# Patient Record
Sex: Female | Born: 1950 | Race: White | Hispanic: No | Marital: Married | Smoking: Never smoker
Health system: Southern US, Community
[De-identification: ages and names within clinical notes are randomized; demographics above are authoritative.]

## PROBLEM LIST (undated history)

## (undated) DIAGNOSIS — E039 Hypothyroidism, unspecified: Secondary | ICD-10-CM

## (undated) DIAGNOSIS — K5792 Diverticulitis of intestine, part unspecified, without perforation or abscess without bleeding: Secondary | ICD-10-CM

## (undated) DIAGNOSIS — F419 Anxiety disorder, unspecified: Secondary | ICD-10-CM

## (undated) DIAGNOSIS — F0390 Unspecified dementia without behavioral disturbance: Secondary | ICD-10-CM

## (undated) DIAGNOSIS — R443 Hallucinations, unspecified: Secondary | ICD-10-CM

## (undated) DIAGNOSIS — E7800 Pure hypercholesterolemia, unspecified: Secondary | ICD-10-CM

## (undated) DIAGNOSIS — I10 Essential (primary) hypertension: Secondary | ICD-10-CM

## (undated) DIAGNOSIS — F32A Depression, unspecified: Secondary | ICD-10-CM

## (undated) DIAGNOSIS — M1990 Unspecified osteoarthritis, unspecified site: Secondary | ICD-10-CM

## (undated) HISTORY — DX: Unspecified osteoarthritis, unspecified site: M19.90

## (undated) HISTORY — PX: COLONOSCOPY: SHX174

## (undated) HISTORY — PX: COLON SURGERY: SHX602

## (undated) HISTORY — PX: THYROIDECTOMY: SHX17

## (undated) HISTORY — DX: Unspecified dementia, unspecified severity, without behavioral disturbance, psychotic disturbance, mood disturbance, and anxiety: F03.90

## (undated) HISTORY — DX: Essential (primary) hypertension: I10

## (undated) HISTORY — DX: Pure hypercholesterolemia, unspecified: E78.00

## (undated) HISTORY — DX: Anxiety disorder, unspecified: F41.9

## (undated) HISTORY — DX: Diverticulitis of intestine, part unspecified, without perforation or abscess without bleeding: K57.92

## (undated) HISTORY — DX: Hypothyroidism, unspecified: E03.9

## (undated) HISTORY — DX: Hallucinations, unspecified: R44.3

## (undated) HISTORY — DX: Depression, unspecified: F32.A

---

## 2019-11-13 ENCOUNTER — Encounter: Payer: Self-pay | Admitting: Primary Care

## 2019-11-13 ENCOUNTER — Ambulatory Visit (INDEPENDENT_AMBULATORY_CARE_PROVIDER_SITE_OTHER): Payer: Medicare Other | Admitting: Primary Care

## 2019-11-13 ENCOUNTER — Other Ambulatory Visit: Payer: Self-pay

## 2019-11-13 ENCOUNTER — Other Ambulatory Visit: Payer: Self-pay | Admitting: Primary Care

## 2019-11-13 VITALS — BP 124/80 | HR 96 | Temp 96.2°F | Ht 64.25 in | Wt 255.0 lb

## 2019-11-13 DIAGNOSIS — Z1231 Encounter for screening mammogram for malignant neoplasm of breast: Secondary | ICD-10-CM | POA: Diagnosis not present

## 2019-11-13 DIAGNOSIS — I10 Essential (primary) hypertension: Secondary | ICD-10-CM | POA: Insufficient documentation

## 2019-11-13 DIAGNOSIS — E890 Postprocedural hypothyroidism: Secondary | ICD-10-CM | POA: Insufficient documentation

## 2019-11-13 DIAGNOSIS — F0391 Unspecified dementia with behavioral disturbance: Secondary | ICD-10-CM

## 2019-11-13 DIAGNOSIS — Z803 Family history of malignant neoplasm of breast: Secondary | ICD-10-CM

## 2019-11-13 DIAGNOSIS — M159 Polyosteoarthritis, unspecified: Secondary | ICD-10-CM

## 2019-11-13 DIAGNOSIS — M1990 Unspecified osteoarthritis, unspecified site: Secondary | ICD-10-CM | POA: Insufficient documentation

## 2019-11-13 DIAGNOSIS — F0390 Unspecified dementia without behavioral disturbance: Secondary | ICD-10-CM | POA: Insufficient documentation

## 2019-11-13 LAB — COMPREHENSIVE METABOLIC PANEL
ALT: 22 U/L (ref 0–35)
AST: 22 U/L (ref 0–37)
Albumin: 4 g/dL (ref 3.5–5.2)
Alkaline Phosphatase: 77 U/L (ref 39–117)
BUN: 18 mg/dL (ref 6–23)
CO2: 29 mEq/L (ref 19–32)
Calcium: 9.1 mg/dL (ref 8.4–10.5)
Chloride: 103 mEq/L (ref 96–112)
Creatinine, Ser: 0.8 mg/dL (ref 0.40–1.20)
GFR: 71.57 mL/min (ref >60.00)
Glucose, Bld: 82 mg/dL (ref 70–99)
Potassium: 4.3 mEq/L (ref 3.5–5.1)
Sodium: 137 mEq/L (ref 135–145)
Total Bilirubin: 0.4 mg/dL (ref 0.2–1.2)
Total Protein: 7.2 g/dL (ref 6.0–8.3)

## 2019-11-13 LAB — CBC
HCT: 43.5 % (ref 36.0–46.0)
Hemoglobin: 14.7 g/dL (ref 12.0–15.0)
MCHC: 33.9 g/dL (ref 30.0–36.0)
MCV: 88.4 fl (ref 78.0–100.0)
Platelets: 276 10*3/uL (ref 150.0–400.0)
RBC: 4.92 Mil/uL (ref 3.87–5.11)
RDW: 13.8 % (ref 11.5–15.5)
WBC: 10.4 10*3/uL (ref 4.0–10.5)

## 2019-11-13 LAB — TSH: TSH: 0.61 u[IU]/mL (ref 0.35–4.50)

## 2019-11-13 MED ORDER — LEVOTHYROXINE SODIUM 125 MCG PO TABS: ORAL_TABLET | ORAL | 3 refills | Status: DC

## 2019-11-13 NOTE — Assessment & Plan Note (Signed)
Chronic, managed on Tramadol for years. Given dementia history would like to wean off.  Reduce to 1/2 tablet BID x 2 weeks, then 1/2 tablet daily x 2 weeks, then stop.  Continue duloxetine. Start Tylenol Arthritis.

## 2019-11-13 NOTE — Patient Instructions (Addendum)
Stop by the lab prior to leaving today. I will notify you of your results once received.   Wean off of the Tramadol. Start by cutting the tablet in half and taking 1/2 tablet daily for two weeks, then 1/2 tablet once daily for 2 weeks, then stop.  In the mean time start Tylenol Arthritis [id number] mg twice daily if needed.  Call the [facility] to schedule the mammogram.   Feel free to contact me via My Chart as discussed.  It was a pleasure to meet you today! Please don't hesitate to call or message me with any questions. Welcome to [facility]!

## 2019-11-13 NOTE — Assessment & Plan Note (Signed)
Mammogram due and ordered today.

## 2019-11-13 NOTE — Progress Notes (Signed)
Subjective:    Patient ID: Melanie Long, female    DOB: 08-Sep-1950, 69 y.o.   MRN: [id number]  HPI  This visit occurred during the SARS-CoV-2 public health emergency.  Safety protocols were in place, including screening questions prior to the visit, additional usage of staff PPE, and extensive cleaning of exam room while observing appropriate contact time as indicated for disinfecting solutions.   Melanie Long is a 69 year old female who presents today to establish care and discuss the problems mentioned below. Will obtain/review records. Her husband is with her today who is providing all of the information for HPI.   1) Essential Hypertension: Currently managed on benazepril 20 mg. She denies chest pain, dizziness, shortness of breath per her husband. They do not check her BP at home.   BP Readings from Last 3 Encounters:  11/13/19 124/80   2) Hypothyroidism: Initially with precancerous thyroid disease, had entire thyroid gland removed around 2005. Currently managed on levothyroxine 125 mcg, has been stable on this dose for years. Her last TSH was in December 2019. She takes her levothyroxine every morning on a empty stomach with water only. No food or medications for one hour. No vitamins for four hours or later.  3) Dementia: Diagnosed nearly 7 years ago. Previously following with neurology who confirmed diagnosis, mostly followed with PCP. She was once managed on Aricept which caused a lot of nausea so she stopped taking. Her husband believes she was diagnosed with vascular dementia.   She does have hallucinations which have been intermittent for years, daily over the last two years. She does have symptoms of sundowners. Currently managed on duloxetine 60 mg for anxiety and depression which helps.   4) Osteoarthritis: Chronic to hands, neck, hips, knees. She is managed on Tramadol 50 mg BID which was prescribed by her arthritis doctor. Sometimes she'll take additional doses.   5) Family  History of Breast Cancer: In her mother who underwent mastectomy. The patient's last mammogram was in early 2020. She does have dense breast tissue, requires 3D mammograms.   Review of Systems  Respiratory: Negative for shortness of breath.   Cardiovascular: Negative for chest pain.  Musculoskeletal: Positive for arthralgias. Negative for joint swelling.  Neurological: Negative for dizziness.  Psychiatric/Behavioral: Positive for hallucinations.       See HPI       Past Medical History:  Diagnosis Date  . Dementia (HCC)   . Diverticulitis   . Essential hypertension   . Hypothyroidism   . Osteoarthritis      Social History   Socioeconomic History  . Marital status: Married    Spouse name: Not on file  . Number of children: Not on file  . Years of education: Not on file  . Highest education level: Not on file  Occupational History  . Not on file  Tobacco Use  . Smoking status: Never Smoker  . Smokeless tobacco: Never Used  Substance and Sexual Activity  . Alcohol use: Not Currently  . Drug use: Not on file  . Sexual activity: Not on file  Other Topics Concern  . Not on file  Social History Narrative  . Not on file   Social Determinants of Health   Financial Resource Strain:   . Difficulty of Paying Living Expenses:   Food Insecurity:   . Worried About Programme researcher, broadcasting/film/video in the Last Year:   . Barista in the Last Year:   Transportation Needs:   .  Lack of Transportation (Medical):   Marland Kitchen Lack of Transportation (Non-Medical):   Physical Activity:   . Days of Exercise per Week:   . Minutes of Exercise per Session:   Stress:   . Feeling of Stress :   Social Connections:   . Frequency of Communication with Friends and Family:   . Frequency of Social Gatherings with Friends and Family:   . Attends Religious Services:   . Active Member of Clubs or Organizations:   . Attends Archivist Meetings:   Marland Kitchen Marital Status:   Intimate Partner Violence:   .  Fear of Current or Ex-Partner:   . Emotionally Abused:   Marland Kitchen Physically Abused:   . Sexually Abused:     Past Surgical History:  Procedure Laterality Date  . COLON SURGERY     Secondary to acute diverticulitis   . THYROIDECTOMY      Family History  Problem Relation Age of Onset  . Arthritis Mother   . Diabetes Mother   . Breast cancer Mother   . Diabetes Father   . Heart attack Father     Allergies  Allergen Reactions  . Penicillins   . Sulfur     Current Outpatient Medications on File Prior to Visit  Medication Sig Dispense Refill  . benazepril (LOTENSIN) 20 MG tablet Take 20 mg by mouth daily.    . DULoxetine (CYMBALTA) 60 MG capsule Take 60 mg by mouth daily.    Marland Kitchen levothyroxine (SYNTHROID) 125 MCG tablet Take 125 mcg by mouth daily before breakfast.    . traMADol (ULTRAM) 50 MG tablet Take 50 mg by mouth every 6 (six) hours as needed.     No current facility-administered medications on file prior to visit.    BP 124/80   Pulse 96   Temp (!) 96.2 F (35.7 C) (Temporal)   Ht 5' 4.25" (1.632 m)   Wt 255 lb (115.7 kg)   SpO2 98%   BMI 43.43 kg/m    Objective:   Physical Exam  Constitutional: She appears well-nourished.  Cardiovascular: Normal rate and regular rhythm.  Respiratory: Effort normal and breath sounds normal.  Musculoskeletal:     Cervical back: Neck supple.  Neurological: She is alert.  Follows some commands, non contributory to HPI  Skin: Skin is warm and dry.  Psychiatric: She has a normal mood and affect.           Assessment & Plan:

## 2019-11-13 NOTE — Assessment & Plan Note (Signed)
Compliant to levothyroxine 125 mcg, taking correctly.  Repeat TSH pending. She will need refills once TSH results.

## 2019-11-13 NOTE — Assessment & Plan Note (Signed)
Chronic, diagnosed at the age of 81, symptoms began several years prior to diagnosis.  Could not tolerate Aricept.  Consider Namenda given hallucinations and anxiety. Continue duloxetine.  First will wean off of Tramadol.

## 2019-11-13 NOTE — Assessment & Plan Note (Signed)
Stable in the office today, CMP pending. Continue benazepril.

## 2019-11-14 ENCOUNTER — Telehealth: Payer: Self-pay | Admitting: Primary Care

## 2019-11-14 ENCOUNTER — Other Ambulatory Visit: Payer: Self-pay | Admitting: Primary Care

## 2019-11-14 DIAGNOSIS — F0391 Unspecified dementia with behavioral disturbance: Secondary | ICD-10-CM

## 2019-11-14 MED ORDER — MEMANTINE HCL 5 MG PO TABS: 5.0000 mg | ORAL_TABLET | Freq: Two times a day (BID) | ORAL | 0 refills | Status: DC

## 2019-11-14 NOTE — Telephone Encounter (Signed)
Spoken and notified patient's husband of Kate Clark's comments. Patient's husband verbalized understanding. 

## 2019-11-14 NOTE — Telephone Encounter (Signed)
Patient's husband returned Chan's call about lab results.

## 2019-11-27 ENCOUNTER — Ambulatory Visit
Admission: RE | Admit: 2019-11-27 | Discharge: 2019-11-27 | Disposition: A | Discharge status: Home / Self Care | Payer: Medicare Other | Source: Ambulatory Visit | Attending: Primary Care | Admitting: Primary Care

## 2019-11-27 DIAGNOSIS — Z803 Family history of malignant neoplasm of breast: Secondary | ICD-10-CM | POA: Diagnosis not present

## 2019-11-27 DIAGNOSIS — Z1231 Encounter for screening mammogram for malignant neoplasm of breast: Secondary | ICD-10-CM | POA: Insufficient documentation

## 2019-12-03 ENCOUNTER — Other Ambulatory Visit: Payer: Self-pay | Admitting: Primary Care

## 2019-12-03 DIAGNOSIS — R928 Other abnormal and inconclusive findings on diagnostic imaging of breast: Secondary | ICD-10-CM

## 2019-12-17 ENCOUNTER — Ambulatory Visit
Admission: RE | Admit: 2019-12-17 | Discharge: 2019-12-17 | Disposition: A | Discharge status: Home / Self Care | Payer: Medicare Other | Source: Ambulatory Visit | Attending: Primary Care | Admitting: Primary Care

## 2019-12-17 DIAGNOSIS — R928 Other abnormal and inconclusive findings on diagnostic imaging of breast: Secondary | ICD-10-CM | POA: Diagnosis present

## 2019-12-18 ENCOUNTER — Other Ambulatory Visit: Payer: Self-pay | Admitting: Primary Care

## 2019-12-18 DIAGNOSIS — R928 Other abnormal and inconclusive findings on diagnostic imaging of breast: Secondary | ICD-10-CM

## 2019-12-24 ENCOUNTER — Other Ambulatory Visit: Payer: Self-pay | Admitting: Primary Care

## 2019-12-24 DIAGNOSIS — I10 Essential (primary) hypertension: Secondary | ICD-10-CM

## 2019-12-25 NOTE — Telephone Encounter (Signed)
Have not prescribed. Last OV on 11/13/2019 (est care). No future OV

## 2019-12-25 NOTE — Telephone Encounter (Signed)
Refill sent to pharmacy.   

## 2019-12-27 ENCOUNTER — Ambulatory Visit
Admission: RE | Admit: 2019-12-27 | Discharge: 2019-12-27 | Disposition: A | Discharge status: Home / Self Care | Payer: Medicare Other | Source: Ambulatory Visit | Attending: Primary Care | Admitting: Primary Care

## 2019-12-27 DIAGNOSIS — R928 Other abnormal and inconclusive findings on diagnostic imaging of breast: Secondary | ICD-10-CM | POA: Insufficient documentation

## 2019-12-27 HISTORY — PX: BREAST BIOPSY: SHX20

## 2019-12-28 LAB — SURGICAL PATHOLOGY

## 2019-12-31 ENCOUNTER — Telehealth: Payer: Self-pay

## 2019-12-31 NOTE — Telephone Encounter (Signed)
Spoke with patient's daughter.  She and the patient's husband have spoken and due to the patient's neurology issues, they have decided to not have the patient go and see a Careers adviser.  12/31/2019 14:48

## 2020-02-03 ENCOUNTER — Other Ambulatory Visit: Payer: Self-pay | Admitting: Primary Care

## 2020-02-03 DIAGNOSIS — F0391 Unspecified dementia with behavioral disturbance: Secondary | ICD-10-CM

## 2020-02-04 NOTE — Telephone Encounter (Signed)
Refills sent to pharmacy. 

## 2020-02-04 NOTE — Telephone Encounter (Signed)
Last prescribed on 11/13/2019 Last OV (establish care) with Mayra Reel on 11/13/2019 No future OV scheduled

## 2020-02-19 ENCOUNTER — Other Ambulatory Visit: Payer: Self-pay | Admitting: Primary Care

## 2020-02-19 DIAGNOSIS — F0391 Unspecified dementia with behavioral disturbance: Secondary | ICD-10-CM

## 2020-02-20 NOTE — Telephone Encounter (Signed)
Spoken to patient's husband and it seem that the bottle that they have at home is not the new Rx that was sent to 02/04/2020. Patient's husband will check with [facility] again and will let us know if we need to re-sent Rx.

## 2020-02-20 NOTE — Telephone Encounter (Signed)
Gentleman left v/m that pt has been taking Memantine 5 mg and med has been helping and wants to know why refill denied and what can be done to get med refilled.

## 2020-05-20 ENCOUNTER — Other Ambulatory Visit: Payer: Self-pay | Admitting: Primary Care

## 2020-05-20 DIAGNOSIS — F0391 Unspecified dementia with behavioral disturbance: Secondary | ICD-10-CM

## 2020-06-11 ENCOUNTER — Other Ambulatory Visit: Payer: Self-pay

## 2020-06-11 MED ORDER — DULOXETINE HCL 60 MG PO CPEP: 60.0000 mg | ORAL_CAPSULE | Freq: Every day | ORAL | 0 refills | Status: DC

## 2020-06-11 NOTE — Telephone Encounter (Signed)
Mr Witherow (do not see DPR) left v/m requesting refill of duloxetine; pt has been out for 2 days and Mr Arroyo thinks pharmacy has already requested refill for duloxetine; do not see refill request in pts chart; pt had establish care visit on 11/13/19 and in note had continue duloxetine. Please advise.

## 2020-06-11 NOTE — Telephone Encounter (Signed)
Ok to fill 

## 2020-06-20 ENCOUNTER — Other Ambulatory Visit: Payer: Self-pay | Admitting: Primary Care

## 2020-06-20 DIAGNOSIS — F0391 Unspecified dementia with behavioral disturbance: Secondary | ICD-10-CM

## 2020-08-18 ENCOUNTER — Other Ambulatory Visit: Payer: Self-pay | Admitting: Primary Care

## 2020-08-18 DIAGNOSIS — F0391 Unspecified dementia with behavioral disturbance: Secondary | ICD-10-CM

## 2020-08-27 ENCOUNTER — Other Ambulatory Visit: Payer: Self-pay | Admitting: Primary Care

## 2020-08-27 DIAGNOSIS — E890 Postprocedural hypothyroidism: Secondary | ICD-10-CM

## 2020-08-27 NOTE — Telephone Encounter (Signed)
Ok to fill 

## 2020-08-27 NOTE — Telephone Encounter (Signed)
Which medication ?

## 2020-08-27 NOTE — Telephone Encounter (Signed)
Pt's husband called and said he accidentally threw her medication in the trash and she needs a new refill sent to pharmacy. He wants to make sure this is approved.

## 2020-08-28 NOTE — Telephone Encounter (Signed)
Patient's husband came by the office.  Levothyroxine was filled last week.  Unfortunately, the medication was thrown away by accident.  Patient has been out of medication for 2 days.  Patient uses [facility]. Patient's husband is requesting medication be refilled as soon as possible.

## 2020-08-28 NOTE — Telephone Encounter (Signed)
Noted.  Appreciate the follow-up.

## 2020-08-28 NOTE — Telephone Encounter (Signed)
Per Jae Dire I have called and made follow up with husband as well as called and cancelled 3 additional refills. Husband is aware of change and no questions.

## 2020-09-21 ENCOUNTER — Other Ambulatory Visit: Payer: Self-pay | Admitting: Primary Care

## 2020-09-21 DIAGNOSIS — I10 Essential (primary) hypertension: Secondary | ICD-10-CM

## 2020-10-10 ENCOUNTER — Other Ambulatory Visit: Payer: Self-pay | Admitting: Primary Care

## 2020-10-10 DIAGNOSIS — F0391 Unspecified dementia with behavioral disturbance: Secondary | ICD-10-CM

## 2020-10-11 ENCOUNTER — Other Ambulatory Visit: Payer: Self-pay | Admitting: Primary Care

## 2020-10-11 DIAGNOSIS — F0391 Unspecified dementia with behavioral disturbance: Secondary | ICD-10-CM

## 2020-11-14 ENCOUNTER — Ambulatory Visit: Payer: Medicare Other | Admitting: Primary Care

## 2020-11-21 ENCOUNTER — Encounter: Payer: Self-pay | Admitting: Primary Care

## 2020-11-21 ENCOUNTER — Other Ambulatory Visit: Payer: Self-pay

## 2020-11-21 ENCOUNTER — Ambulatory Visit (INDEPENDENT_AMBULATORY_CARE_PROVIDER_SITE_OTHER): Payer: Medicare Other | Admitting: Primary Care

## 2020-11-21 VITALS — BP 110/62 | HR 88 | Temp 97.4°F | Ht 64.25 in | Wt 246.0 lb

## 2020-11-21 DIAGNOSIS — H6120 Impacted cerumen, unspecified ear: Secondary | ICD-10-CM | POA: Insufficient documentation

## 2020-11-21 DIAGNOSIS — M159 Polyosteoarthritis, unspecified: Secondary | ICD-10-CM | POA: Diagnosis not present

## 2020-11-21 DIAGNOSIS — E890 Postprocedural hypothyroidism: Secondary | ICD-10-CM | POA: Diagnosis not present

## 2020-11-21 DIAGNOSIS — F0391 Unspecified dementia with behavioral disturbance: Secondary | ICD-10-CM

## 2020-11-21 DIAGNOSIS — I10 Essential (primary) hypertension: Secondary | ICD-10-CM

## 2020-11-21 DIAGNOSIS — Z23 Encounter for immunization: Secondary | ICD-10-CM

## 2020-11-21 DIAGNOSIS — Z803 Family history of malignant neoplasm of breast: Secondary | ICD-10-CM

## 2020-11-21 DIAGNOSIS — E2839 Other primary ovarian failure: Secondary | ICD-10-CM

## 2020-11-21 DIAGNOSIS — Z1211 Encounter for screening for malignant neoplasm of colon: Secondary | ICD-10-CM

## 2020-11-21 DIAGNOSIS — H6123 Impacted cerumen, bilateral: Secondary | ICD-10-CM

## 2020-11-21 DIAGNOSIS — Z1231 Encounter for screening mammogram for malignant neoplasm of breast: Secondary | ICD-10-CM

## 2020-11-21 LAB — CBC
HCT: 44.3 % (ref 36.0–46.0)
Hemoglobin: 15 g/dL (ref 12.0–15.0)
MCHC: 33.8 g/dL (ref 30.0–36.0)
MCV: 89.7 fl (ref 78.0–100.0)
Platelets: 253 10*3/uL (ref 150.0–400.0)
RBC: 4.94 Mil/uL (ref 3.87–5.11)
RDW: 13.4 % (ref 11.5–15.5)
WBC: 8.2 10*3/uL (ref 4.0–10.5)

## 2020-11-21 LAB — COMPREHENSIVE METABOLIC PANEL
ALT: 24 U/L (ref 0–35)
AST: 24 U/L (ref 0–37)
Albumin: 3.9 g/dL (ref 3.5–5.2)
Alkaline Phosphatase: 72 U/L (ref 39–117)
BUN: 14 mg/dL (ref 6–23)
CO2: 31 mEq/L (ref 19–32)
Calcium: 9.5 mg/dL (ref 8.4–10.5)
Chloride: 103 mEq/L (ref 96–112)
Creatinine, Ser: 0.78 mg/dL (ref 0.40–1.20)
GFR: 78.31 mL/min (ref >60.00)
Glucose, Bld: 83 mg/dL (ref 70–99)
Potassium: 4.2 mEq/L (ref 3.5–5.1)
Sodium: 138 mEq/L (ref 135–145)
Total Bilirubin: 0.5 mg/dL (ref 0.2–1.2)
Total Protein: 7 g/dL (ref 6.0–8.3)

## 2020-11-21 LAB — LIPID PANEL
Cholesterol: 183 mg/dL (ref 0–200)
HDL: 46.8 mg/dL (ref >39.00)
LDL Cholesterol: 113 mg/dL — ABNORMAL HIGH (ref 0–99)
NonHDL: 136.16
Total CHOL/HDL Ratio: 4
Triglycerides: 116 mg/dL (ref 0.0–149.0)
VLDL: 23.2 mg/dL (ref 0.0–40.0)

## 2020-11-21 MED ORDER — ZOSTER VAC RECOMB ADJUVANTED 50 MCG/0.5ML IM SUSR: 0.5000 mL | Freq: Once | INTRAMUSCULAR | 1 refills | Status: AC

## 2020-11-21 NOTE — Assessment & Plan Note (Addendum)
Moderate cerumen to bilateral canals.   Patient and husband consent to irrigation. Irrigation completed, patient tolerated well. Canals bilaterally with mild irritation, mild cerumen remains to left canal.  Discussed use of Debrox drops.

## 2020-11-21 NOTE — Assessment & Plan Note (Signed)
Due for repeat mammogram in May 2021, orders placed.

## 2020-11-21 NOTE — Assessment & Plan Note (Signed)
She is taking levothyroxine 125 mcg correctly. Repeat TSH pending.

## 2020-11-21 NOTE — Progress Notes (Signed)
Subjective:    Patient ID: Melanie Long, female    DOB: 06/12/1951, 70 y.o.   MRN: [id number]  HPI  Melanie Long is a very pleasant 70 y.o. female who presents today with her husband for follow up of chronic conditions. She has a history of dementia and so her husband is providing a lot of information for HPI.  Immunizations: -Influenza: Completed this season  -Covid-19: three vaccines -Shingles: Has not completed -Pneumonia: 2019  Mammogram: May 2021 Dexa: Due Colonoscopy: Due, over 10 years.   BP Readings from Last 3 Encounters:  11/21/20 110/62  11/13/19 124/80        Review of Systems  Respiratory: Negative for shortness of breath.   Cardiovascular: Negative for chest pain.  Gastrointestinal: Negative for constipation and diarrhea.       Chronic abdominal hernia  Musculoskeletal: Positive for arthralgias.  Neurological: Negative for headaches.  Psychiatric/Behavioral: The patient is not nervous/anxious.          Past Medical History:  Diagnosis Date  . Dementia (HCC)   . Diverticulitis   . Essential hypertension   . Hypothyroidism   . Osteoarthritis     Social History   Socioeconomic History  . Marital status: Married    Spouse name: Not on file  . Number of children: Not on file  . Years of education: Not on file  . Highest education level: Not on file  Occupational History  . Not on file  Tobacco Use  . Smoking status: Never Smoker  . Smokeless tobacco: Never Used  Substance and Sexual Activity  . Alcohol use: Not Currently  . Drug use: Not on file  . Sexual activity: Not on file  Other Topics Concern  . Not on file  Social History Narrative  . Not on file   Social Determinants of Health   Financial Resource Strain: Not on file  Food Insecurity: Not on file  Transportation Needs: Not on file  Physical Activity: Not on file  Stress: Not on file  Social Connections: Not on file  Intimate Partner Violence: Not on file    Past  Surgical History:  Procedure Laterality Date  . BREAST BIOPSY Right 12/27/2019   distortion, ribbon, path pending  . COLON SURGERY     Secondary to acute diverticulitis   . THYROIDECTOMY      Family History  Problem Relation Age of Onset  . Arthritis Mother   . Diabetes Mother   . Breast cancer Mother        60's  . Diabetes Father   . Heart attack Father     Allergies  Allergen Reactions  . Ciprofloxacin   . Elemental Sulfur   . Penicillins   . Sulfa Antibiotics   . Tetanus-Diphth-Acell Pertussis     Current Outpatient Medications on File Prior to Visit  Medication Sig Dispense Refill  . aspirin 325 MG tablet aspirin 325 mg tablet  Take 1 tablet every day by oral route.    . B Complex Vitamins (VITAMIN B COMPLEX PO) vitamin B complex  1 a day    . benazepril (LOTENSIN) 20 MG tablet TAKE 1 TABLET(20 MG) BY MOUTH DAILY FOR BLOOD PRESSURE 90 tablet 0  . Cholecalciferol (VITAMIN D3) 10 MCG (400 UNIT) CAPS Vitamin D3    . DULoxetine (CYMBALTA) 60 MG capsule Take 1 capsule (60 mg total) by mouth daily. For anxiety. 90 capsule 0  . levothyroxine (SYNTHROID) 125 MCG tablet TAKE 1 TABLET BY MOUTH EVERY MORNING  ON AN EMPTY STOMACH WITH WATER ONLY. NO FOOD OR OTHER MEDICATIONS FOR 30 MINS 90 tablet 3  . memantine (NAMENDA) 5 MG tablet TAKE 1 TABLET TWICE DAILY FOR DEMENTIA. NEED TO BE SEEN IN OFFICE FOR MORE REFILLS. 180 tablet 1  . Omega-3 Fatty Acids (FISH OIL) 500 MG CAPS Fish Oil  1 a day     No current facility-administered medications on file prior to visit.    BP 110/62   Pulse 88   Temp (!) 97.4 F (36.3 C) (Temporal)   Ht 5' 4.25" (1.632 m)   Wt 246 lb (111.6 kg)   SpO2 98%   BMI 41.90 kg/m  Objective:   Physical Exam HENT:     Ears:     Comments: Bilateral cerumen impaction Cardiovascular:     Rate and Rhythm: Normal rate and regular rhythm.  Pulmonary:     Effort: Pulmonary effort is normal.     Breath sounds: Normal breath sounds.  Abdominal:      Palpations: Abdomen is soft.     Tenderness: There is no abdominal tenderness.     Hernia: A hernia is present.     Comments: Large abdominal hernia noted, chronic to right lower and mid lower side. Non tender. Patient in no distress.   Musculoskeletal:     Cervical back: Neck supple.  Skin:    General: Skin is warm and dry.  Psychiatric:        Mood and Affect: Mood normal.           Assessment & Plan:      This visit occurred during the SARS-CoV-2 public health emergency.  Safety protocols were in place, including screening questions prior to the visit, additional usage of staff PPE, and extensive cleaning of exam room while observing appropriate contact time as indicated for disinfecting solutions.

## 2020-11-21 NOTE — Patient Instructions (Signed)
Stop by the lab prior to leaving today. I will notify you of your results once received.   [facility] and [facility] density scan.   It was a pleasure to see you today!   

## 2020-11-21 NOTE — Assessment & Plan Note (Signed)
Well controlled in the office today, continue benazepril 20 mg daily. CMP pending.

## 2020-11-21 NOTE — Assessment & Plan Note (Signed)
Chronic, stable.  Using Tylenol PRN, continue same.

## 2020-11-21 NOTE — Assessment & Plan Note (Signed)
Stable and doing well on Namenda 5 mg BID and Cymbalta 60 mg. Continue same.

## 2020-12-01 ENCOUNTER — Other Ambulatory Visit: Payer: Self-pay | Admitting: Primary Care

## 2020-12-01 DIAGNOSIS — E890 Postprocedural hypothyroidism: Secondary | ICD-10-CM

## 2020-12-02 NOTE — Telephone Encounter (Signed)
Please call patient's husband, I'm so sorry but forgot to obtain a thyroid lab test during her recent visit! Needs lab only appt at their convenience.

## 2020-12-11 ENCOUNTER — Encounter: Payer: Self-pay | Admitting: *Deleted

## 2020-12-13 NOTE — Telephone Encounter (Signed)
Left message to return call to our office.  

## 2020-12-25 ENCOUNTER — Other Ambulatory Visit: Payer: Self-pay | Admitting: Primary Care

## 2020-12-25 DIAGNOSIS — I10 Essential (primary) hypertension: Secondary | ICD-10-CM

## 2021-01-13 ENCOUNTER — Other Ambulatory Visit: Payer: Self-pay | Admitting: Primary Care

## 2021-01-13 DIAGNOSIS — F0391 Unspecified dementia with behavioral disturbance: Secondary | ICD-10-CM

## 2021-01-27 NOTE — Telephone Encounter (Signed)
LVM to call back.

## 2021-02-11 ENCOUNTER — Other Ambulatory Visit: Payer: Self-pay | Admitting: Primary Care

## 2021-02-11 DIAGNOSIS — F0391 Unspecified dementia with behavioral disturbance: Secondary | ICD-10-CM

## 2021-02-17 ENCOUNTER — Ambulatory Visit: Payer: Medicare Other | Admitting: Primary Care

## 2021-02-24 ENCOUNTER — Other Ambulatory Visit: Payer: Self-pay | Admitting: Primary Care

## 2021-02-24 DIAGNOSIS — E890 Postprocedural hypothyroidism: Secondary | ICD-10-CM

## 2021-02-25 NOTE — Telephone Encounter (Signed)
Please notify patient's husband that we are sorry to bother them but I need an updated thyroid lab test on Melanie Long. Lab only appt. Non fasting. Thanks.

## 2021-02-27 NOTE — Telephone Encounter (Signed)
Called family lab appointment has been made. No further action needed at this time.

## 2021-03-03 ENCOUNTER — Other Ambulatory Visit: Payer: Self-pay

## 2021-03-03 ENCOUNTER — Other Ambulatory Visit (INDEPENDENT_AMBULATORY_CARE_PROVIDER_SITE_OTHER): Payer: Medicare Other

## 2021-03-03 DIAGNOSIS — E890 Postprocedural hypothyroidism: Secondary | ICD-10-CM

## 2021-03-03 LAB — TSH: TSH: 0.47 u[IU]/mL (ref 0.35–5.50)

## 2021-05-23 ENCOUNTER — Other Ambulatory Visit: Payer: Self-pay | Admitting: Primary Care

## 2021-05-23 DIAGNOSIS — E890 Postprocedural hypothyroidism: Secondary | ICD-10-CM

## 2021-06-01 ENCOUNTER — Telehealth: Payer: Self-pay

## 2021-06-01 ENCOUNTER — Other Ambulatory Visit: Payer: Self-pay | Admitting: Primary Care

## 2021-06-01 ENCOUNTER — Telehealth: Payer: Self-pay | Admitting: Primary Care

## 2021-06-01 DIAGNOSIS — R928 Other abnormal and inconclusive findings on diagnostic imaging of breast: Secondary | ICD-10-CM

## 2021-06-01 DIAGNOSIS — Z1231 Encounter for screening mammogram for malignant neoplasm of breast: Secondary | ICD-10-CM

## 2021-06-01 NOTE — Telephone Encounter (Signed)
Pt. Calling to schedule colonoscopy 

## 2021-06-01 NOTE — Telephone Encounter (Signed)
Was not sure if with history you needed to order anything other than screening

## 2021-06-01 NOTE — Telephone Encounter (Signed)
Pt daughter called they went to schedule her mothers mammogram and they need an order placed for a bilateral diagnostic mammogram before they will do. Please sent to [facility]

## 2021-06-02 ENCOUNTER — Other Ambulatory Visit: Payer: Self-pay

## 2021-06-02 DIAGNOSIS — Z1211 Encounter for screening for malignant neoplasm of colon: Secondary | ICD-10-CM

## 2021-06-02 MED ORDER — NA SULFATE-K SULFATE-MG SULF 17.5-3.13-1.6 GM/177ML PO SOLN: 1.0000 | Freq: Once | ORAL | 0 refills | Status: AC

## 2021-06-02 NOTE — Telephone Encounter (Signed)
Noted, orders signed

## 2021-06-02 NOTE — Progress Notes (Signed)
Gastroenterology Pre-Procedure Review  Request Date: 06/18/21 Requesting Physician: Dr. Allegra Lai  PATIENT REVIEW QUESTIONS: The patient responded to the following health history questions as indicated:    1. Are you having any GI issues? no 2. Do you have a personal history of Polyps? no 3. Do you have a family history of Colon Cancer or Polyps? no 4. Diabetes Mellitus? no 5. Joint replacements in the past 12 months?no 6. Major health problems in the past 3 months?yes (patient does have dementia.) 7. Any artificial heart valves, MVP, or defibrillator?no    MEDICATIONS & ALLERGIES:    Patient reports the following regarding taking any anticoagulation/antiplatelet therapy:   Plavix, Coumadin, Eliquis, Xarelto, Lovenox, Pradaxa, Brilinta, or Effient? no Aspirin? yes (325 mg)  Patient confirms/reports the following medications:  Current Outpatient Medications  Medication Sig Dispense Refill   aspirin 325 MG tablet aspirin 325 mg tablet  Take 1 tablet every day by oral route.     B Complex Vitamins (VITAMIN B COMPLEX PO) vitamin B complex  1 a day     benazepril (LOTENSIN) 20 MG tablet TAKE 1 TABLET(20 MG) BY MOUTH DAILY FOR BLOOD PRESSURE 90 tablet 3   Cholecalciferol (VITAMIN D3) 10 MCG (400 UNIT) CAPS Vitamin D3     DULoxetine (CYMBALTA) 60 MG capsule TAKE 1 CAPSULE(60 MG) BY MOUTH DAILY FOR ANXIETY 90 capsule 3   levothyroxine (SYNTHROID) 125 MCG tablet TAKE 1 TABLET BY MOUTH EVERY MORNING ON AN EMPTY STOMACH AND WITH WATER 90 tablet 1   memantine (NAMENDA) 5 MG tablet TAKE 1 TABLET BY MOUTH TWICE DAILY FOR DEMENTIA 180 tablet 2   Omega-3 Fatty Acids (FISH OIL) 500 MG CAPS Fish Oil  1 a day     No current facility-administered medications for this visit.    Patient confirms/reports the following allergies:  Allergies  Allergen Reactions   Ciprofloxacin    Elemental Sulfur    Penicillins    Sulfa Antibiotics    Tetanus-Diphth-Acell Pertussis     No orders of the defined  types were placed in this encounter.   AUTHORIZATION INFORMATION Primary Insurance: 1D#: Group #:  Secondary Insurance: 1D#: Group #:  SCHEDULE INFORMATION: Date: 06/18/21 Time: Location: [facility]

## 2021-06-02 NOTE — Telephone Encounter (Signed)
Procedure scheduled for 06/18/21. 

## 2021-06-17 ENCOUNTER — Encounter: Payer: Self-pay | Admitting: Gastroenterology

## 2021-06-18 ENCOUNTER — Other Ambulatory Visit: Payer: Self-pay

## 2021-06-18 MED ORDER — ONDANSETRON HCL 4 MG PO TABS: 4.0000 mg | ORAL_TABLET | ORAL | 0 refills | Status: DC | PRN

## 2021-06-18 NOTE — Progress Notes (Signed)
Called patient rescheduled colonoscopy to Jul 16 2021 and sent in zofran for her as well for nausea as requested by Dr Allegra Lai

## 2021-06-23 NOTE — Telephone Encounter (Signed)
Can you find out what they are requesting?

## 2021-06-23 NOTE — Telephone Encounter (Signed)
Called [facility] they are closed for lunch. Will call back later.

## 2021-07-16 ENCOUNTER — Other Ambulatory Visit: Payer: Self-pay

## 2021-07-16 ENCOUNTER — Ambulatory Visit
Admission: RE | Admit: 2021-07-16 | Discharge: 2021-07-16 | Disposition: A | Discharge status: Home / Self Care | Payer: Medicare Other | Attending: Gastroenterology | Admitting: Gastroenterology

## 2021-07-16 ENCOUNTER — Encounter: Payer: Self-pay | Admitting: Gastroenterology

## 2021-07-16 ENCOUNTER — Ambulatory Visit: Payer: Medicare Other | Admitting: Certified Registered Nurse Anesthetist

## 2021-07-16 ENCOUNTER — Encounter
Admission: RE | Disposition: A | Discharge status: Home / Self Care | Payer: Self-pay | Source: Home / Self Care | Attending: Gastroenterology

## 2021-07-16 DIAGNOSIS — I10 Essential (primary) hypertension: Secondary | ICD-10-CM | POA: Diagnosis not present

## 2021-07-16 DIAGNOSIS — Z1211 Encounter for screening for malignant neoplasm of colon: Secondary | ICD-10-CM

## 2021-07-16 DIAGNOSIS — E039 Hypothyroidism, unspecified: Secondary | ICD-10-CM | POA: Diagnosis not present

## 2021-07-16 DIAGNOSIS — Q438 Other specified congenital malformations of intestine: Secondary | ICD-10-CM | POA: Diagnosis not present

## 2021-07-16 HISTORY — PX: COLONOSCOPY WITH PROPOFOL: SHX5780

## 2021-07-16 SURGERY — COLONOSCOPY WITH PROPOFOL
Anesthesia: General

## 2021-07-16 MED ORDER — PROPOFOL 10 MG/ML IV BOLUS
INTRAVENOUS | Status: DC | PRN
  Administered 2021-07-16: 80 mg via INTRAVENOUS

## 2021-07-16 MED ORDER — SODIUM CHLORIDE 0.9 % IV SOLN: INTRAVENOUS | Status: DC

## 2021-07-16 MED ORDER — PROPOFOL 500 MG/50ML IV EMUL
INTRAVENOUS | Status: DC | PRN
  Administered 2021-07-16: 120 ug/kg/min via INTRAVENOUS

## 2021-07-16 NOTE — Op Note (Signed)
[facility] Gastroenterology Patient Name: Melanie Long Procedure Date: 07/16/2021 10:33 AM MRN: [id number] Account #: [id number] Date of Birth: 11-17-1950 Admit Type: Outpatient Age: 70 Room: [facility] ENDO ROOM 2 Gender: Female Note Status: Finalized Instrument Name: Nelda Marseille [id number] Procedure:             Colonoscopy Indications:           Screening for colorectal malignant neoplasm, Last                         colonoscopy 10 years ago Providers:             Toney Reil MD, MD Referring MD:          Doreene Nest (Referring MD) Medicines:             General Anesthesia Complications:         No immediate complications. Estimated blood loss: None. Procedure:             Pre-Anesthesia Assessment:                        - Prior to the procedure, a History and Physical was                         performed, and patient medications and allergies were                         reviewed. The patient is competent. The risks and                         benefits of the procedure and the sedation options and                         risks were discussed with the patient. All questions                         were answered and informed consent was obtained.                         Patient identification and proposed procedure were                         verified by the physician, the nurse, the                         anesthesiologist, the anesthetist and the technician                         in the pre-procedure area in the procedure room in the                         endoscopy suite. Mental Status Examination: alert and                         oriented. Airway Examination: normal oropharyngeal                         airway and neck mobility. Respiratory Examination:  clear to auscultation. CV Examination: normal.                         Prophylactic Antibiotics: The patient does not require                         prophylactic  antibiotics. Prior Anticoagulants: The                         patient has taken no previous anticoagulant or                         antiplatelet agents. ASA Grade Assessment: II - A                         patient with mild systemic disease. After reviewing                         the risks and benefits, the patient was deemed in                         satisfactory condition to undergo the procedure. The                         anesthesia plan was to use general anesthesia.                         Immediately prior to administration of medications,                         the patient was re-assessed for adequacy to receive                         sedatives. The heart rate, respiratory rate, oxygen                         saturations, blood pressure, adequacy of pulmonary                         ventilation, and response to care were monitored                         throughout the procedure. The physical status of the                         patient was re-assessed after the procedure.                        After obtaining informed consent, the colonoscope was                         passed under direct vision. Throughout the procedure,                         the patient's blood pressure, pulse, and oxygen                         saturations were monitored continuously. The  Colonoscope was introduced through the anus and                         advanced to the the cecum, identified by appendiceal                         orifice and ileocecal valve. The colonoscopy was                         unusually difficult due to inadequate bowel prep and                         significant looping. Successful completion of the                         procedure was aided by applying abdominal pressure and                         lavage. The patient tolerated the procedure well. The                         quality of the bowel preparation was evaluated using                          the BBPS [facility] Bowel Preparation Scale) with scores                         of: Right Colon = 2 (minor amount of residual                         staining, small fragments of stool and/or opaque                         liquid, but mucosa seen well), Transverse Colon = 2                         (minor amount of residual staining, small fragments of                         stool and/or opaque liquid, but mucosa seen well) and                         Left Colon = 2 (minor amount of residual staining,                         small fragments of stool and/or opaque liquid, but                         mucosa seen well). The total BBPS score equals 6. The                         quality of the bowel preparation was fair. Findings:      The perianal and digital rectal examinations were normal. Pertinent       negatives include normal sphincter tone and no palpable rectal lesions.      Copious quantities of liquid stool was found in the entire colon,  precluding visualization. Lavage of the area was performed using 200 -       500 mL of sterile water, resulting in clearance with fair visualization.      The retroflexed view of the distal rectum and anal verge was normal and       showed no anal or rectal abnormalities. Impression:            - Preparation of the colon was fair.                        - Stool in the entire examined colon.                        - The distal rectum and anal verge are normal on                         retroflexion view.                        - No specimens collected. Recommendation:        - Discharge patient to home (with spouse).                        - Resume previous diet today.                        - Continue present medications.                        - Repeat colonoscopy in 3 years with 2 day prep                         because the bowel preparation was suboptimal. Procedure Code(s):     --- Professional ---                        P8099,  Colorectal cancer screening; colonoscopy on                         individual not meeting criteria for high risk Diagnosis Code(s):     --- Professional ---                        Z12.11, Encounter for screening for malignant neoplasm                         of colon CPT copyright 2019 American Medical Association. All rights reserved. The codes documented in this report are preliminary and upon coder review may  be revised to meet current compliance requirements. Dr. Libby Maw Toney Reil MD, MD 07/16/2021 11:01:13 AM This report has been signed electronically. Number of Addenda: 0 Note Initiated On: 07/16/2021 10:33 AM Scope Withdrawal Time: 0 hours 10 minutes 32 seconds  Total Procedure Duration: 0 hours 18 minutes 23 seconds  Estimated Blood Loss:  Estimated blood loss: none.      [facility]

## 2021-07-16 NOTE — Anesthesia Preprocedure Evaluation (Signed)
Anesthesia Evaluation  Patient identified by MRN, date of birth, ID band Patient awake    Reviewed: Allergy & Precautions, NPO status , Patient's Chart, lab work & pertinent test results  History of Anesthesia Complications Negative for: history of anesthetic complications  Airway Mallampati: IV   Neck ROM: Full    Dental  (+)    Pulmonary neg pulmonary ROS,    Pulmonary exam normal breath sounds clear to auscultation       Cardiovascular hypertension, Normal cardiovascular exam Rhythm:Regular Rate:Normal     Neuro/Psych negative neurological ROS     GI/Hepatic negative GI ROS,   Endo/Other  Hypothyroidism   Renal/GU negative Renal ROS     Musculoskeletal   Abdominal   Peds  Hematology negative hematology ROS (+)   Anesthesia Other Findings   Reproductive/Obstetrics                             Anesthesia Physical Anesthesia Plan  ASA: 2  Anesthesia Plan: General   Post-op Pain Management:    Induction: Intravenous  PONV Risk Score and Plan: 3 and Propofol infusion, TIVA and Treatment may vary due to age or medical condition  Airway Management Planned: Natural Airway  Additional Equipment:   Intra-op Plan:   Post-operative Plan:   Informed Consent: I have reviewed the patients History and Physical, chart, labs and discussed the procedure including the risks, benefits and alternatives for the proposed anesthesia with the patient or authorized representative who has indicated his/her understanding and acceptance.     Consent reviewed with POA  Plan Discussed with: CRNA  Anesthesia Plan Comments: (LMA/GETA backup discussed.  Patient consented for risks of anesthesia including but not limited to:  - adverse reactions to medications - damage to eyes, teeth, lips or other oral mucosa - nerve damage due to positioning  - sore throat or hoarseness - damage to heart, brain,  nerves, lungs, other parts of body or loss of life  Informed patient about role of CRNA in peri- and intra-operative care.  Patient voiced understanding.)        Anesthesia Quick Evaluation

## 2021-07-16 NOTE — Anesthesia Postprocedure Evaluation (Signed)
Anesthesia Post Note  Patient: Melanie Long  Procedure(s) Performed: COLONOSCOPY WITH PROPOFOL  Patient location during evaluation: PACU Anesthesia Type: General Level of consciousness: awake and alert, oriented and patient cooperative Pain management: pain level controlled Vital Signs Assessment: post-procedure vital signs reviewed and stable Respiratory status: spontaneous breathing, nonlabored ventilation and respiratory function stable Cardiovascular status: blood pressure returned to baseline and stable Postop Assessment: adequate PO intake Anesthetic complications: no   No notable events documented.   Last Vitals:  Vitals:   07/16/21 0937 07/16/21 1102  BP: 136/74 107/68  Pulse: 76   Resp: 18   Temp: (!) 36.1 C (!) 35.8 C  SpO2: 100%     Last Pain:  Vitals:   07/16/21 1102  TempSrc: Temporal  PainSc: Asleep                 Reed Breech

## 2021-07-16 NOTE — H&P (Signed)
Arlyss Repress, MD [address]  [address]  [place], [place] [address]  Main: [phone]  Fax: [phone] Pager: [phone]  Primary Care Physician:  Doreene Nest, NP Primary Gastroenterologist:  Dr. Arlyss Repress  Pre-Procedure History & Physical: HPI:  Melanie Long is a 70 y.o. female is here for an colonoscopy.   Past Medical History:  Diagnosis Date   Dementia (HCC)    Diverticulitis    Essential hypertension    Hypothyroidism    Osteoarthritis     Past Surgical History:  Procedure Laterality Date   BREAST BIOPSY Right 12/27/2019   distortion, ribbon, path pending   COLON SURGERY     Secondary to acute diverticulitis    COLONOSCOPY     THYROIDECTOMY      Prior to Admission medications   Medication Sig Start Date End Date Taking? Authorizing Provider  aspirin 325 MG tablet aspirin 325 mg tablet  Take 1 tablet every day by oral route.   Yes [provider]  B Complex Vitamins (VITAMIN B COMPLEX PO) vitamin B complex  1 a day   Yes [provider]  benazepril (LOTENSIN) 20 MG tablet TAKE 1 TABLET(20 MG) BY MOUTH DAILY FOR BLOOD PRESSURE 12/25/20  Yes Doreene Nest, NP  Cholecalciferol (VITAMIN D3) 10 MCG (400 UNIT) CAPS Vitamin D3   Yes [provider]  DULoxetine (CYMBALTA) 60 MG capsule TAKE 1 CAPSULE(60 MG) BY MOUTH DAILY FOR ANXIETY 01/13/21  Yes Doreene Nest, NP  levothyroxine (SYNTHROID) 125 MCG tablet TAKE 1 TABLET BY MOUTH EVERY MORNING ON AN EMPTY STOMACH AND WITH WATER 05/23/21  Yes Doreene Nest, NP  memantine (NAMENDA) 5 MG tablet TAKE 1 TABLET BY MOUTH TWICE DAILY FOR DEMENTIA 02/12/21  Yes Doreene Nest, NP  Omega-3 Fatty Acids (FISH OIL) 500 MG CAPS Fish Oil  1 a day   Yes [provider]  ondansetron (ZOFRAN) 4 MG tablet Take 1 tablet (4 mg total) by mouth every 4 (four) hours as needed for nausea or vomiting. 06/18/21   Toney Reil, MD    Allergies as of 06/02/2021 -  Review Complete 11/21/2020  Allergen Reaction Noted   Ciprofloxacin  11/20/2020   Elemental sulfur  11/13/2019   Penicillins  11/13/2019   Sulfa antibiotics  11/20/2020   Tetanus-diphth-acell pertussis  11/20/2020    Family History  Problem Relation Age of Onset   Arthritis Mother    Diabetes Mother    Breast cancer Mother        65's   Diabetes Father    Heart attack Father     Social History   Socioeconomic History   Marital status: Married    Spouse name: Not on file   Number of children: Not on file   Years of education: Not on file   Highest education level: Not on file  Occupational History   Not on file  Tobacco Use   Smoking status: Never   Smokeless tobacco: Never  Substance and Sexual Activity   Alcohol use: Not Currently   Drug use: Never   Sexual activity: Not on file  Other Topics Concern   Not on file  Social History Narrative   Not on file   Social Determinants of Health   Financial Resource Strain: Not on file  Food Insecurity: Not on file  Transportation Needs: Not on file  Physical Activity: Not on file  Stress: Not on file  Social Connections: Not on file  Intimate Partner Violence: Not on file    Review of Systems: See HPI, otherwise negative ROS  Physical Exam: BP 107/68   Pulse 76   Temp (!) 96.4 F (35.8 C) (Temporal)   Resp 18   Ht 5\' 7"  (1.702 m)   Wt 225 lb (102.1 kg)   SpO2 100%   BMI 35.24 kg/m  General:   Alert,  pleasant and cooperative in NAD Head:  Normocephalic and atraumatic. Neck:  Supple; no masses or thyromegaly. Lungs:  Clear throughout to auscultation.    Heart:  Regular rate and rhythm. Abdomen:  Soft, nontender and nondistended. Normal bowel sounds, without guarding, and without rebound.   Neurologic:  Alert and  oriented x4;  grossly normal neurologically.  Impression/Plan: Melanie Long is here for an colonoscopy to be performed for colon cancer screening  Risks, benefits, limitations, and  alternatives regarding  colonoscopy have been reviewed with the patient.  Questions have been answered.  All parties agreeable.   Lendon Ka, MD  07/16/2021, 11:10 AM

## 2021-07-16 NOTE — Transfer of Care (Signed)
Immediate Anesthesia Transfer of Care Note  Patient: Melanie Long  Procedure(s) Performed: COLONOSCOPY WITH PROPOFOL  Patient Location: PACU and Endoscopy Unit  Anesthesia Type:General  Level of Consciousness: drowsy  Airway & Oxygen Therapy: Patient Spontanous Breathing  Post-op Assessment: Report given to RN  Post vital signs: stable  Last Vitals:  Vitals Value Taken Time  BP    Temp    Pulse    Resp    SpO2      Last Pain:  Vitals:   07/16/21 0937  TempSrc: Temporal  PainSc: 0-No pain         Complications: No notable events documented.

## 2021-07-17 ENCOUNTER — Encounter: Payer: Self-pay | Admitting: Gastroenterology

## 2021-08-05 ENCOUNTER — Ambulatory Visit
Admission: RE | Admit: 2021-08-05 | Discharge: 2021-08-05 | Disposition: A | Discharge status: Home / Self Care | Payer: Medicare Other | Source: Ambulatory Visit | Attending: Primary Care | Admitting: Primary Care

## 2021-08-05 ENCOUNTER — Other Ambulatory Visit: Payer: Self-pay

## 2021-08-05 DIAGNOSIS — Z1231 Encounter for screening mammogram for malignant neoplasm of breast: Secondary | ICD-10-CM | POA: Diagnosis present

## 2021-08-05 DIAGNOSIS — R928 Other abnormal and inconclusive findings on diagnostic imaging of breast: Secondary | ICD-10-CM | POA: Insufficient documentation

## 2021-11-19 ENCOUNTER — Other Ambulatory Visit: Payer: Self-pay | Admitting: Primary Care

## 2021-11-19 DIAGNOSIS — E890 Postprocedural hypothyroidism: Secondary | ICD-10-CM

## 2021-11-19 NOTE — Telephone Encounter (Signed)
Patient is due for follow-up.  Needs to be scheduled as soon as possible. ? ?Please call and speak with her husband. ?

## 2021-11-27 ENCOUNTER — Telehealth: Payer: Self-pay | Admitting: Primary Care

## 2021-11-27 NOTE — Telephone Encounter (Signed)
Pt's spouse called and said Melanie Long called them but they did not receive the call in time. Wants to speak with her about the call.  ?

## 2021-11-27 NOTE — Telephone Encounter (Signed)
Called l/m for patient husband to call our office.  ?

## 2021-11-28 ENCOUNTER — Other Ambulatory Visit: Payer: Self-pay | Admitting: Primary Care

## 2021-11-28 DIAGNOSIS — F03918 Unspecified dementia, unspecified severity, with other behavioral disturbance: Secondary | ICD-10-CM

## 2021-11-29 NOTE — Telephone Encounter (Signed)
Patient is due for office visit.  ?She will need to be seen for further refills. ? ?Call her husband. ?

## 2021-12-01 ENCOUNTER — Encounter: Payer: Self-pay | Admitting: Primary Care

## 2021-12-01 ENCOUNTER — Ambulatory Visit (INDEPENDENT_AMBULATORY_CARE_PROVIDER_SITE_OTHER): Payer: Medicare Other | Admitting: Primary Care

## 2021-12-01 DIAGNOSIS — I10 Essential (primary) hypertension: Secondary | ICD-10-CM | POA: Diagnosis not present

## 2021-12-01 DIAGNOSIS — F03B4 Unspecified dementia, moderate, with anxiety: Secondary | ICD-10-CM

## 2021-12-01 DIAGNOSIS — Z803 Family history of malignant neoplasm of breast: Secondary | ICD-10-CM

## 2021-12-01 DIAGNOSIS — E890 Postprocedural hypothyroidism: Secondary | ICD-10-CM | POA: Diagnosis not present

## 2021-12-01 DIAGNOSIS — H6123 Impacted cerumen, bilateral: Secondary | ICD-10-CM | POA: Diagnosis not present

## 2021-12-01 DIAGNOSIS — F03918 Unspecified dementia, unspecified severity, with other behavioral disturbance: Secondary | ICD-10-CM

## 2021-12-01 MED ORDER — MEMANTINE HCL 5 MG PO TABS: ORAL_TABLET | ORAL | 3 refills | Status: DC

## 2021-12-01 NOTE — Assessment & Plan Note (Signed)
Mammogram up to date. Continue annual screenings. 

## 2021-12-01 NOTE — Assessment & Plan Note (Signed)
Controlled today, but I do suspect she may be experiencing postural hypotension at home. ? ?Recommended her husband purchase a blood pressure cuff and start monitoring readings at home.  If her readings are as low as they have been during prior visits then we will reduce her benazepril to 10 mg daily. ? ?CMP pending. ?Continue benazepril 20 g daily for now. ?

## 2021-12-01 NOTE — Progress Notes (Signed)
? ?Subjective:  ? ? Patient ID: Melanie Long, female    DOB: 1951-01-15, 71 y.o.   MRN: [id number] ? ?HPI ? ?Melanie Long is a very pleasant 71 y.o. female with a history of dementia, hypertension, postoperative hypothyroidism, osteoarthritis who presents today with her husband for follow-up of chronic conditions.  Her husband provides most of the information for HPI given her dementia. ? ?1) Hypothyroidism: Currently managed on levothyroxine 125 mcg. She takes her levothyroxine every morning on an empty stomach with water only.  ? ?No food or other medications for 30 minutes.  ? ?No heartburn medication, iron pills, calcium, or magnesium pills within four hours of taking levothyroxine. She is  ? ?2) Hypertension: Currently managed on benazepril 20 mg daily. She has noticed dizziness with position changes, particularly when rising from a seated position. She doesn't experience dizziness when walking, sitting.  ? ?BP Readings from Last 3 Encounters:  ?12/01/21 134/82  ?07/16/21 107/68  ?11/21/20 110/62  ? ?3) Dementia: Chronic. Currently managed on duloxetine 60 mg daily, memantine 5 mg twice daily. Her husband is her primary caregiver, endorses continued slow declined in his wife. Recently evaluated by optometry, vision was stable without significant changes. He has noticed that her hearing may have declined.  ? ?She has started waking during the night and having difficult falling back asleep as she doesn't recognize where she is. This began about 2 months ago. Overall Cymbalta 60 mg has helped, but there are breakthrough symptoms of anxiety. She does experience  hallucinations, but overall doesn't react as bad as she used to.  ? ?Her husband is having to help more with ADL's including bathing and dressing herself. She mostly eats with her hands.  He has the support of his daughter who lives 15 minutes down the road. ? ? ?Review of Systems  ?Eyes:  Negative for visual disturbance.  ?Respiratory:  Negative for  shortness of breath.   ?Cardiovascular:  Negative for chest pain.  ?Neurological:  Positive for dizziness. Negative for headaches.  ?Psychiatric/Behavioral:  Positive for sleep disturbance.   ? ?   ? ? ?Past Medical History:  ?Diagnosis Date  ? Dementia (HCC)   ? Diverticulitis   ? Essential hypertension   ? Hypothyroidism   ? Osteoarthritis   ? ? ?Social History  ? ?Socioeconomic History  ? Marital status: Married  ?  Spouse name: Not on file  ? Number of children: Not on file  ? Years of education: Not on file  ? Highest education level: Not on file  ?Occupational History  ? Not on file  ?Tobacco Use  ? Smoking status: Never  ? Smokeless tobacco: Never  ?Substance and Sexual Activity  ? Alcohol use: Not Currently  ? Drug use: Never  ? Sexual activity: Not on file  ?Other Topics Concern  ? Not on file  ?Social History Narrative  ? Not on file  ? ?Social Determinants of Health  ? ?Financial Resource Strain: Not on file  ?Food Insecurity: Not on file  ?Transportation Needs: Not on file  ?Physical Activity: Not on file  ?Stress: Not on file  ?Social Connections: Not on file  ?Intimate Partner Violence: Not on file  ? ? ?Past Surgical History:  ?Procedure Laterality Date  ? BREAST BIOPSY Right 12/27/2019  ? distortion, ribbon, discordant  ? COLON SURGERY    ? Secondary to acute diverticulitis   ? COLONOSCOPY    ? COLONOSCOPY WITH PROPOFOL N/A 07/16/2021  ? Procedure: COLONOSCOPY WITH PROPOFOL;  Surgeon: Toney Reil, MD;  Location: [facility] ENDOSCOPY;  Service: Gastroenterology;  Laterality: N/A;  PATIENT HAS DEMENTIA  ?HUSBAND IS POA - TO COME TO ENDO WITH PATIENT  ? THYROIDECTOMY    ? ? ?Family History  ?Problem Relation Age of Onset  ? Arthritis Mother   ? Diabetes Mother   ? Breast cancer Mother   ?     68's  ? Diabetes Father   ? Heart attack Father   ? ? ?Allergies  ?Allergen Reactions  ? Ciprofloxacin   ? Elemental Sulfur   ? Penicillins   ? Sulfa Antibiotics   ? Tetanus-Diphth-Acell Pertussis   ? ? ?Current  Outpatient Medications on File Prior to Visit  ?Medication Sig Dispense Refill  ? aspirin 325 MG tablet aspirin 325 mg tablet ? Take 1 tablet every day by oral route.    ? B Complex Vitamins (VITAMIN B COMPLEX PO) vitamin B complex ? 1 a day    ? benazepril (LOTENSIN) 20 MG tablet TAKE 1 TABLET(20 MG) BY MOUTH DAILY FOR BLOOD PRESSURE 90 tablet 3  ? Cholecalciferol (VITAMIN D3) 10 MCG (400 UNIT) CAPS Vitamin D3    ? DULoxetine (CYMBALTA) 60 MG capsule TAKE 1 CAPSULE(60 MG) BY MOUTH DAILY FOR ANXIETY 90 capsule 3  ? levothyroxine (SYNTHROID) 125 MCG tablet Take 1 tablet by mouth every morning on an empty stomach with water only.  No food or other medications for 30 minutes. Office visit required for further refills. 30 tablet 0  ? memantine (NAMENDA) 5 MG tablet Take 1 tablet (5 mg total) by mouth 2 (two) times daily. FOR DEMENTIA. Office visit required for further refills. 60 tablet 0  ? Omega-3 Fatty Acids (FISH OIL) 500 MG CAPS Fish Oil ? 1 a day    ? ?No current facility-administered medications on file prior to visit.  ? ? ?BP 134/82   Pulse 84   Temp 98.6 ?F (37 ?C) (Oral)   Ht 5\' 7"  (1.702 m)   Wt 207 lb (93.9 kg)   SpO2 99%   BMI 32.42 kg/m?  ?Objective:  ? Physical Exam ?HENT:  ?   Right Ear: There is impacted cerumen.  ?   Left Ear: There is impacted cerumen.  ?Cardiovascular:  ?   Rate and Rhythm: Normal rate and regular rhythm.  ?Pulmonary:  ?   Effort: Pulmonary effort is normal.  ?   Breath sounds: Normal breath sounds.  ?Musculoskeletal:  ?   Cervical back: Neck supple.  ?Skin: ?   General: Skin is warm and dry.  ?Neurological:  ?   Mental Status: She is alert.  ?   Comments: Follows a few commands, not all commands. ?Unable to get up and down from exam table without direction and assistance.   ?Psychiatric:     ?   Mood and Affect: Mood normal.  ? ? ? ? ? ?   ?Assessment & Plan:  ? ? ? ? ?This visit occurred during the SARS-CoV-2 public health emergency.  Safety protocols were in place,  including screening questions prior to the visit, additional usage of staff PPE, and extensive cleaning of exam room while observing appropriate contact time as indicated for disinfecting solutions.  ?

## 2021-12-01 NOTE — Telephone Encounter (Signed)
Patient seen in office today. 

## 2021-12-01 NOTE — Assessment & Plan Note (Signed)
Discussed for her husband to move her vitamin D to bedtime.  Otherwise she is taking levothyroxine correctly. ? ?Continue levothyroxine 125 mcg daily. ?Repeat TSH pending. ?

## 2021-12-01 NOTE — Patient Instructions (Signed)
We increased the dose of your memantine for dementia.  Continue 1 tablet every morning, increase to 2 tablets at night. ? ?Start monitoring your blood pressure at home. Ensure that you have rested for 30 minutes prior to checking your blood pressure.  ? ?Record your readings and notify me if you see numbers consistently in the low 100s on top and/or 50s/60s on bottom. ? ?Stop by the lab prior to leaving today. I will notify you of your results once received.  ? ?It was a pleasure to see you today! ? ? ?

## 2021-12-01 NOTE — Telephone Encounter (Signed)
Patient seen in office today no further action needed.  ?

## 2021-12-01 NOTE — Assessment & Plan Note (Signed)
Evident bilaterally on exam today. ? ?Patient's husband consents for irrigation. ?Bilateral canals irrigated. ?Patient tolerated well. ? ?TMs and canals post irrigation unremarkable. ?

## 2021-12-01 NOTE — Assessment & Plan Note (Addendum)
Slowly progressing. ?Very well cared for by her husband. ? ?Continue duloxetine 60 mg daily. ?Increase memantine to 5 mg in the morning and 10 mg at night.  Her husband will update. ? ?I offered a community care nurse evaluation for support, her husband currently declines but will notify when this is needed. ?

## 2021-12-02 LAB — COMPREHENSIVE METABOLIC PANEL
ALT: 15 U/L (ref 0–35)
AST: 17 U/L (ref 0–37)
Albumin: 3.9 g/dL (ref 3.5–5.2)
Alkaline Phosphatase: 84 U/L (ref 39–117)
BUN: 17 mg/dL (ref 6–23)
CO2: 30 mEq/L (ref 19–32)
Calcium: 9 mg/dL (ref 8.4–10.5)
Chloride: 103 mEq/L (ref 96–112)
Creatinine, Ser: 0.83 mg/dL (ref 0.40–1.20)
GFR: 72.16 mL/min (ref >60.00)
Glucose, Bld: 108 mg/dL — ABNORMAL HIGH (ref 70–99)
Potassium: 3.9 mEq/L (ref 3.5–5.1)
Sodium: 141 mEq/L (ref 135–145)
Total Bilirubin: 0.5 mg/dL (ref 0.2–1.2)
Total Protein: 6.6 g/dL (ref 6.0–8.3)

## 2021-12-02 LAB — LIPID PANEL
Cholesterol: 196 mg/dL (ref 0–200)
HDL: 49.6 mg/dL (ref >39.00)
LDL Cholesterol: 122 mg/dL — ABNORMAL HIGH (ref 0–99)
NonHDL: 146.67
Total CHOL/HDL Ratio: 4
Triglycerides: 124 mg/dL (ref 0.0–149.0)
VLDL: 24.8 mg/dL (ref 0.0–40.0)

## 2021-12-02 LAB — TSH: TSH: 3.15 u[IU]/mL (ref 0.35–5.50)

## 2021-12-09 ENCOUNTER — Other Ambulatory Visit: Payer: Self-pay | Admitting: Family Medicine

## 2021-12-09 DIAGNOSIS — E782 Mixed hyperlipidemia: Secondary | ICD-10-CM

## 2021-12-09 MED ORDER — ATORVASTATIN CALCIUM 10 MG PO TABS: 10.0000 mg | ORAL_TABLET | Freq: Every day | ORAL | 3 refills | Status: DC

## 2021-12-09 NOTE — Progress Notes (Signed)
Sending in medication for HLD ?

## 2021-12-09 NOTE — Progress Notes (Signed)
Called and lvm for pat to call us back. 

## 2021-12-11 NOTE — Progress Notes (Signed)
Patient's husband informed Lipitor was sent in. He states he picked it up yesterday.  ?

## 2021-12-11 NOTE — Progress Notes (Signed)
Called and left voicemail for patient to return my call

## 2021-12-21 ENCOUNTER — Other Ambulatory Visit: Payer: Self-pay | Admitting: Primary Care

## 2021-12-21 DIAGNOSIS — E890 Postprocedural hypothyroidism: Secondary | ICD-10-CM

## 2022-01-15 ENCOUNTER — Other Ambulatory Visit: Payer: Self-pay | Admitting: Primary Care

## 2022-01-15 DIAGNOSIS — F03918 Unspecified dementia, unspecified severity, with other behavioral disturbance: Secondary | ICD-10-CM

## 2022-01-16 ENCOUNTER — Other Ambulatory Visit: Payer: Self-pay | Admitting: Primary Care

## 2022-01-16 DIAGNOSIS — F03918 Unspecified dementia, unspecified severity, with other behavioral disturbance: Secondary | ICD-10-CM

## 2022-01-29 ENCOUNTER — Ambulatory Visit: Payer: Medicare Other

## 2022-01-29 ENCOUNTER — Telehealth: Payer: Self-pay

## 2022-01-29 NOTE — Telephone Encounter (Signed)
[facility] Night - Client Nonclinical Telephone Record  [facility] Client [facility] Night - Client Client Site [facility] - Night Contact Type Call Who Is Calling Patient / Member / Family / Caregiver Caller Name Tila Millirons Phone Number [phone] Patient Name Melanie Long Patient DOB [birth year] Call Type Message Only Information Provided Reason for Call Request to Cancel Office Appointment Initial Comment Caller states patient was supposed to have a survey in the morning. Patient with dementia. Caller wants to cancel the appointment tomorrow 6/16 at 8AM Patient request to speak to RN No Additional Comment Thank you Disp. Time Disposition Final User 01/28/2022 7:01:11 PM General Information Provided Yes Jimmie Molly Call Closed By: Jimmie Molly Transaction Date/Time: 01/28/2022 6:58:20 PM (ET

## 2022-02-02 ENCOUNTER — Other Ambulatory Visit: Payer: Self-pay | Admitting: Primary Care

## 2022-02-02 DIAGNOSIS — I10 Essential (primary) hypertension: Secondary | ICD-10-CM

## 2022-02-05 ENCOUNTER — Emergency Department (HOSPITAL_COMMUNITY): Payer: Medicare Other

## 2022-02-05 ENCOUNTER — Other Ambulatory Visit: Payer: Self-pay

## 2022-02-05 ENCOUNTER — Emergency Department (HOSPITAL_COMMUNITY)
Admission: EM | Admit: 2022-02-05 | Discharge: 2022-02-05 | Disposition: A | Discharge status: Home / Self Care | Payer: Medicare Other | Attending: Emergency Medicine | Admitting: Emergency Medicine

## 2022-02-05 ENCOUNTER — Encounter (HOSPITAL_COMMUNITY): Payer: Self-pay

## 2022-02-05 DIAGNOSIS — F0390 Unspecified dementia without behavioral disturbance: Secondary | ICD-10-CM | POA: Insufficient documentation

## 2022-02-05 DIAGNOSIS — Z79899 Other long term (current) drug therapy: Secondary | ICD-10-CM | POA: Diagnosis not present

## 2022-02-05 DIAGNOSIS — R569 Unspecified convulsions: Secondary | ICD-10-CM | POA: Diagnosis present

## 2022-02-05 DIAGNOSIS — I10 Essential (primary) hypertension: Secondary | ICD-10-CM | POA: Insufficient documentation

## 2022-02-05 DIAGNOSIS — Z7982 Long term (current) use of aspirin: Secondary | ICD-10-CM | POA: Insufficient documentation

## 2022-02-05 LAB — BASIC METABOLIC PANEL
Anion gap: 12 (ref 5–15)
BUN: 19 mg/dL (ref 8–23)
CO2: 21 mmol/L — ABNORMAL LOW (ref 22–32)
Calcium: 8.7 mg/dL — ABNORMAL LOW (ref 8.9–10.3)
Chloride: 103 mmol/L (ref 98–111)
Creatinine, Ser: 0.95 mg/dL (ref 0.44–1.00)
GFR, Estimated: >60 mL/min (ref >60)
Glucose, Bld: 114 mg/dL — ABNORMAL HIGH (ref 70–99)
Potassium: 3.8 mmol/L (ref 3.5–5.1)
Sodium: 136 mmol/L (ref 135–145)

## 2022-02-05 LAB — CBC
HCT: 46.8 % — ABNORMAL HIGH (ref 36.0–46.0)
Hemoglobin: 15.6 g/dL — ABNORMAL HIGH (ref 12.0–15.0)
MCH: 31 pg (ref 26.0–34.0)
MCHC: 33.3 g/dL (ref 30.0–36.0)
MCV: 93 fL (ref 80.0–100.0)
Platelets: 178 10*3/uL (ref 150–400)
RBC: 5.03 MIL/uL (ref 3.87–5.11)
RDW: 13.1 % (ref 11.5–15.5)
WBC: 5.9 10*3/uL (ref 4.0–10.5)
nRBC: 0 % (ref 0.0–0.2)

## 2022-02-05 LAB — HEPATIC FUNCTION PANEL
ALT: 17 U/L (ref 0–44)
AST: 27 U/L (ref 15–41)
Albumin: 3.4 g/dL — ABNORMAL LOW (ref 3.5–5.0)
Alkaline Phosphatase: 73 U/L (ref 38–126)
Bilirubin, Direct: 0.3 mg/dL — ABNORMAL HIGH (ref 0.0–0.2)
Indirect Bilirubin: 0.5 mg/dL (ref 0.3–0.9)
Total Bilirubin: 0.8 mg/dL (ref 0.3–1.2)
Total Protein: 6.5 g/dL (ref 6.5–8.1)

## 2022-02-05 MED ORDER — LEVETIRACETAM IN NACL 1000 MG/100ML IV SOLN
1000.0000 mg | Freq: Once | INTRAVENOUS | Status: AC
  Administered 2022-02-05: 1000 mg via INTRAVENOUS
  Filled 2022-02-05: qty 100

## 2022-02-05 MED ORDER — LEVETIRACETAM 500 MG PO TABS: 500.0000 mg | ORAL_TABLET | Freq: Two times a day (BID) | ORAL | 0 refills | Status: DC

## 2022-02-05 NOTE — ED Triage Notes (Signed)
Per husband patient is somnolent compared to what she does normally. Husband reports that the patient is normally active and verbal. Per EMS patient has not spoken since their arrival.

## 2022-03-04 ENCOUNTER — Encounter: Payer: Self-pay | Admitting: Neurology

## 2022-03-04 ENCOUNTER — Other Ambulatory Visit: Payer: Self-pay | Admitting: Neurology

## 2022-03-04 ENCOUNTER — Ambulatory Visit (INDEPENDENT_AMBULATORY_CARE_PROVIDER_SITE_OTHER): Payer: Medicare Other | Admitting: Neurology

## 2022-03-04 VITALS — BP 125/74 | HR 79 | Ht 67.0 in | Wt 206.5 lb

## 2022-03-04 DIAGNOSIS — G300 Alzheimer's disease with early onset: Secondary | ICD-10-CM

## 2022-03-04 DIAGNOSIS — F02C18 Dementia in other diseases classified elsewhere, severe, with other behavioral disturbance: Secondary | ICD-10-CM | POA: Diagnosis not present

## 2022-03-04 DIAGNOSIS — G40909 Epilepsy, unspecified, not intractable, without status epilepticus: Secondary | ICD-10-CM | POA: Diagnosis not present

## 2022-03-04 MED ORDER — DIVALPROEX SODIUM ER 500 MG PO TB24: 500.0000 mg | ORAL_TABLET | Freq: Every day | ORAL | 0 refills | Status: DC

## 2022-03-04 MED ORDER — DIVALPROEX SODIUM ER 250 MG PO TB24: 250.0000 mg | ORAL_TABLET | Freq: Every day | ORAL | 11 refills | Status: DC

## 2022-03-04 NOTE — Progress Notes (Signed)
[facility]  PATIENT: Melanie Long DOB: 18-Mar-1951  REQUESTING CLINICIAN: Dione Booze, MD HISTORY FROM: Husband  REASON FOR VISIT: New onset seizure    HISTORICAL  CHIEF COMPLAINT:  Chief Complaint  Patient presents with   New Patient (Initial Visit)    Rm 12. Accompanied by husband, Darrett. NP/ED referral for seizure like activity.    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  Husband reports symptoms started 2 years ago when she started having myoclonic jerks mainly in the morning.  On June 23 during her sleep patient had full body convulsion lasting about 30 seconds, husband's report after the event patient could not wake and it was very hard to control her body.  EMS was called and patient was taken to the ED.  Husband denies any tongue biting, denies any urinary incontinence and no injuries.  In the ED, she did have a head CT which showed generalized brain atrophy but no other abnormality, and patient was started on Keppra 500 mg twice daily.  Husband reports since starting Keppra patient has been very lethargic.     OTHER MEDICAL CONDITIONS: Moderate to severe Dementia, hypertension, hyperlipidemia   REVIEW OF SYSTEMS: Full 14 system review of systems performed and negative with exception of: Unable to fully obtain   ALLERGIES: Allergies  Allergen Reactions   Ciprofloxacin    Elemental Sulfur    Penicillins    Sulfa Antibiotics    Tetanus-Diphth-Acell Pertussis     HOME MEDICATIONS: Outpatient Medications Prior to Visit  Medication Sig Dispense Refill   aspirin 325 MG tablet Take 325 mg by mouth daily.     atorvastatin (LIPITOR) 10 MG tablet Take 1 tablet (10 mg total) by mouth daily. 90 tablet 3   benazepril  (LOTENSIN) 20 MG tablet TAKE 1 TABLET(20 MG) BY MOUTH DAILY FOR BLOOD PRESSURE (Patient taking differently: Take 20 mg by mouth daily.) 90 tablet 2   cholecalciferol (VITAMIN D) 25 MCG (1000 UNIT) tablet Take 400 Units by mouth daily.     DULoxetine (CYMBALTA) 60 MG capsule TAKE 1 CAPSULE(60 MG) BY MOUTH DAILY FOR ANXIETY (Patient taking differently: Take 60 mg by mouth daily.) 90 capsule 2   levothyroxine (SYNTHROID) 125 MCG tablet TAKE ONE TABLET BY MOUTH EVERY MORNING ON AN EMPTY STOMACH WITH WATER ONLY.. NO FOOD OR OTHER MEDICATION FOR 30 MINUTES (Patient taking differently: Take 125 mcg by mouth daily before breakfast.) 90 tablet 3   memantine (NAMENDA) 5 MG tablet Take 1 tablet by mouth every morning and 2 tablets by mouth every evening FOR DEMENTIA. (Patient taking differently: Take 5-10 mg by mouth See admin instructions. 5 mg in the morning  10 mg at bedtime) 270 tablet 3   Omega-3 Fatty Acids (FISH OIL) 1000 MG CAPS Take 1,000 mg by mouth daily.     levETIRAcetam (KEPPRA) 500 MG tablet Take 1 tablet (500 mg total) by mouth 2 (two) times daily. 60 tablet 0   acetaminophen (TYLENOL) 500 MG tablet Take 1,000 mg by mouth every 6 (six) hours as needed for moderate pain or headache.     ibuprofen (ADVIL) 200 MG tablet Take 400 mg by mouth every 6 (six) hours as needed for headache or moderate pain.     No facility-administered medications prior to visit.    PAST  MEDICAL HISTORY: Past Medical History:  Diagnosis Date   Anxiety    Dementia (HCC)    Depression    Diverticulitis    Essential hypertension    Hallucinations    High cholesterol    Hypothyroidism    Osteoarthritis     PAST SURGICAL HISTORY: Past Surgical History:  Procedure Laterality Date   BREAST BIOPSY Right 12/27/2019   distortion, ribbon, discordant   COLON SURGERY     Secondary to acute diverticulitis    COLONOSCOPY     COLONOSCOPY WITH PROPOFOL N/A 07/16/2021   Procedure: COLONOSCOPY WITH PROPOFOL;  Surgeon:  Toney Reil, MD;  Location: [facility] ENDOSCOPY;  Service: Gastroenterology;  Laterality: N/A;  PATIENT HAS DEMENTIA  HUSBAND IS POA - TO COME TO ENDO WITH PATIENT   THYROIDECTOMY      FAMILY HISTORY: Family History  Problem Relation Age of Onset   Arthritis Mother    Diabetes Mother    Breast cancer Mother        23's   Diabetes Father    Heart attack Father     SOCIAL HISTORY: Social History   Socioeconomic History   Marital status: Married    Spouse name: Not on file   Number of children: 1   Years of education: College   Highest education level: Not on file  Occupational History   Not on file  Tobacco Use   Smoking status: Never   Smokeless tobacco: Never  Substance and Sexual Activity   Alcohol use: Not Currently   Drug use: Never   Sexual activity: Not on file  Other Topics Concern   Not on file  Social History Narrative   Patient is retired.   Lives at home with her husband.   Drinks 3 cups of coffee daily.   Social Determinants of Health   Financial Resource Strain: Not on file  Food Insecurity: Not on file  Transportation Needs: Not on file  Physical Activity: Not on file  Stress: Not on file  Social Connections: Not on file  Intimate Partner Violence: Not on file    PHYSICAL EXAM  GENERAL EXAM/CONSTITUTIONAL: Vitals:  Vitals:   03/04/22 1405  BP: 125/74  Pulse: 79  Weight: 206 lb 8 oz (93.7 kg)  Height: 5\' 7"  (1.702 m)   Body mass index is 32.34 kg/m. Wt Readings from Last 3 Encounters:  03/04/22 206 lb 8 oz (93.7 kg)  12/01/21 207 lb (93.9 kg)  07/16/21 225 lb (102.1 kg)   Patient is in no distress; well developed, nourished and groomed; neck is supple. Dependent on husband   EYES: Pupils round and reactive to light, Visual fields full to confrontation, Extraocular movements intacts,  No results found.  MUSCULOSKELETAL: Gait, strength, tone, movements noted in Neurologic exam below  NEUROLOGIC: MENTAL STATUS:      No data  to display         Awake, oriented to person but not place or time.  Unable to tell me her date of birth.  Unable to recall the ED visit.  Husband states that patient cannot remember what happened now ago. For floor equal round and reactive, unable to count count digit unable to identify object even when given to her hands.  Face is symmetric.  Full strength in upper and lower extremity.  Need husband for ambulation as she is holding to husband.      DIAGNOSTIC DATA (LABS, IMAGING, TESTING) - I reviewed patient records, labs, notes, testing and imaging myself  where available.  Lab Results  Component Value Date   WBC 5.9 02/05/2022   HGB 15.6 (H) 02/05/2022   HCT 46.8 (H) 02/05/2022   MCV 93.0 02/05/2022   PLT 178 02/05/2022      Component Value Date/Time   NA 136 02/05/2022 0257   K 3.8 02/05/2022 0257   CL 103 02/05/2022 0257   CO2 21 (L) 02/05/2022 0257   GLUCOSE 114 (H) 02/05/2022 0257   BUN 19 02/05/2022 0257   CREATININE 0.95 02/05/2022 0257   CALCIUM 8.7 (L) 02/05/2022 0257   PROT 6.5 02/05/2022 0257   ALBUMIN 3.4 (L) 02/05/2022 0257   AST 27 02/05/2022 0257   ALT 17 02/05/2022 0257   ALKPHOS 73 02/05/2022 0257   BILITOT 0.8 02/05/2022 0257   GFRNONAA >60 02/05/2022 0257   Lab Results  Component Value Date   CHOL 196 12/01/2021   HDL 49.60 12/01/2021   LDLCALC 122 (H) 12/01/2021   TRIG 124.0 12/01/2021   No results found for: "HGBA1C" No results found for: "VITAMINB12" Lab Results  Component Value Date   TSH 3.15 12/01/2021    Head CT 02/05/22 1. No acute finding. No focal cortical finding to correlate with the history. 2. Brain atrophy    ASSESSMENT AND PLAN  71 y.o. 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  Husband reports for the past 2 years patient has been having myoclonic jerks.  After the seizure she was started on  Keppra, no additional myoclonic jerks but patient is very lethargic.  Due to lethargy, I will switch her Keppra to Depakote 750 mg extended release daily.  Her liver function test was normal. In regards of the dementia, husband reports that patient has not seen a neurologist for more than 10 years.  She is totally dependent on him.  She does have hallucinations, and also having difficulty identifying objects.  Husband reported that patient has 2 cats that she loves dearly but sometimes she acts like she cannot see the cats when the cats are sitting on her lap and other time she is playing with the cats when there are no cats around.  I advised her to come back in a month for formal dementia work-up.  They are comfortable with plan.   1. Seizure disorder (HCC)   2. 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  Other recommendations include using caution when using heavy equipment or power tools. Avoid working on ladders or at heights. Take showers instead of baths.  Do not swim alone.  Ensure the water temperature is not too high on the home water heater. Do not go swimming alone. Do not lock yourself in a room alone (i.e. bathroom). When caring for infants or small children, sit down when holding, feeding, or changing them to minimize risk of injury to the child in the event you have a seizure. Maintain good sleep hygiene. Avoid alcohol.  Also recommend adequate sleep, hydration, good diet and minimize stress.   During the Seizure  - First, ensure adequate ventilation and place patients on the floor on their  left side  Loosen clothing around the neck and  ensure the airway is patent. If the patient is clenching the teeth, do not force the mouth open with any object as this can cause severe damage - Remove all items from the surrounding that can be hazardous. The patient may be oblivious to what's happening and may not even know what he or she is doing. If the patient is confused and wandering, either gently guide him/her away and block access to outside areas - Reassure the individual and be comforting - Call 911. In most cases, the seizure ends before EMS arrives. However, there are cases when seizures may last over 3 to 5 minutes. Or the individual may have developed breathing difficulties or severe injuries. If a pregnant patient or a person with diabetes develops a seizure, it is prudent to call an ambulance. - Finally, if the  patient does not regain full consciousness, then call EMS. Most patients will remain confused for about 45 to 90 minutes after a seizure, so you must use judgment in calling for help. - Avoid restraints but make sure the patient is in a bed with padded side rails - Place the individual in a lateral position with the neck slightly flexed; this will help the saliva drain from the mouth and prevent the tongue from falling backward - Remove all nearby furniture and other hazards from the area - Provide verbal assurance as the individual is regaining consciousness - Provide the patient with privacy if possible - Call for help and start treatment as ordered by the caregiver   After the Seizure (Postictal Stage)  After a seizure, most patients experience confusion, fatigue, muscle pain and/or a headache. Thus, one should permit the individual to sleep. For the next few days, reassurance is essential. Being calm and helping reorient the person is also of importance.  Most seizures are painless and end spontaneously. Seizures are not harmful to others but can lead to complications such as stress on the lungs, brain and the heart.  Individuals with prior lung problems may develop labored breathing and respiratory distress.     Orders Placed This Encounter  Procedures   EEG adult    Meds ordered this encounter  Medications   divalproex (DEPAKOTE ER) 500 MG 24 hr tablet    Sig: Take 1 tablet (500 mg total) by mouth daily.    Dispense:  30 tablet    Refill:  0    To take with an additional 250 mg for a total of 750 mg   divalproex (DEPAKOTE ER) 250 MG 24 hr tablet    Sig: Take 1 tablet (250 mg total) by mouth daily. To take with an additional 500 mg for a total of 750 mg    Dispense:  30 tablet    Refill:  11    Return in about 1 month (around 04/04/2022).    Windell Norfolk, MD 03/05/2022, 10:55 AM  [facility] [address] [place], [place] [address] [phone]

## 2022-03-05 NOTE — Patient Instructions (Addendum)
Discontinue Keppra Start with Depakote extended release 750 mg daily Routine EEG Follow-up in 1 month for a formal evaluation of dementia/memory

## 2022-03-16 ENCOUNTER — Other Ambulatory Visit: Payer: Medicare Other | Admitting: *Deleted

## 2022-03-22 ENCOUNTER — Other Ambulatory Visit: Payer: Medicare Other | Admitting: *Deleted

## 2022-03-24 ENCOUNTER — Ambulatory Visit (INDEPENDENT_AMBULATORY_CARE_PROVIDER_SITE_OTHER): Payer: Medicare Other | Admitting: Neurology

## 2022-03-24 DIAGNOSIS — G40909 Epilepsy, unspecified, not intractable, without status epilepticus: Secondary | ICD-10-CM | POA: Diagnosis not present

## 2022-03-24 NOTE — Procedures (Signed)
    History:  71 year old woman with history of seizure  EEG classification: Awake and drowsy  Description of the recording: The background rhythms of this recording consists symmetric delta-theta activity. As the record progresses, the patient appears to remain in the waking state throughout the recording. Photic stimulation was performed, did not show any abnormalities. Hyperventilation was also performed, did not show any abnormalities. Toward the end of the recording, the patient enters the drowsy state. The patient never enters stage II sleep. No abnormal epileptiform discharges seen during this recording. There was no focal slowing. EKG monitor shows no evidence of cardiac rhythm abnormalities with a heart rate of 72.  Abnormality: Mild diffuse slowing   Impression: This is an abnormal EEG recording in the waking and drowsy state due to mild diffuse slowing. Diffuse slowing is consistent with a generalized brain dysfunction, nonspecific.    Windell Norfolk, MD [facility]

## 2022-03-27 ENCOUNTER — Other Ambulatory Visit: Payer: Self-pay | Admitting: Neurology

## 2022-04-13 ENCOUNTER — Ambulatory Visit (INDEPENDENT_AMBULATORY_CARE_PROVIDER_SITE_OTHER): Payer: Medicare Other | Admitting: Neurology

## 2022-04-13 ENCOUNTER — Encounter: Payer: Self-pay | Admitting: Neurology

## 2022-04-13 VITALS — BP 100/68 | HR 84 | Ht 67.0 in | Wt 203.0 lb

## 2022-04-13 DIAGNOSIS — G300 Alzheimer's disease with early onset: Secondary | ICD-10-CM | POA: Diagnosis not present

## 2022-04-13 DIAGNOSIS — F02C18 Dementia in other diseases classified elsewhere, severe, with other behavioral disturbance: Secondary | ICD-10-CM | POA: Diagnosis not present

## 2022-04-13 DIAGNOSIS — G40909 Epilepsy, unspecified, not intractable, without status epilepticus: Secondary | ICD-10-CM | POA: Diagnosis not present

## 2022-04-13 MED ORDER — DIVALPROEX SODIUM ER 500 MG PO TB24
500.0000 mg | ORAL_TABLET | Freq: Every day | ORAL | 3 refills | Status: DC
Start: 2022-04-13 — End: 2023-04-14

## 2022-04-13 MED ORDER — MEMANTINE HCL 10 MG PO TABS: 10.0000 mg | ORAL_TABLET | Freq: Two times a day (BID) | ORAL | 3 refills | Status: DC

## 2022-04-13 MED ORDER — ADLARITY 10 MG/DAY TD PTWK: 10.0000 mg | MEDICATED_PATCH | TRANSDERMAL | 12 refills | Status: DC

## 2022-04-13 NOTE — Patient Instructions (Addendum)
Continue with Depakote 750 mg at nighttime Increase Namenda to 10 mg twice daily Start with Donepezil, transdermal patch (she failed oral donepezil, had side effects, could not tolerate the medication) Continue with your other medications Please contact us if you are interested in pursuing genetic testing for your wife and if applicable daughter  Follow-up in 1 year

## 2022-04-13 NOTE — Progress Notes (Signed)
[facility]  PATIENT: Melanie Long DOB: 19-May-1951  REQUESTING CLINICIAN: Pleas Koch, NP HISTORY FROM: Husband  REASON FOR VISIT: New onset seizure    HISTORICAL  CHIEF COMPLAINT:  Chief Complaint  Patient presents with   Follow-up    Rm 15. Accompanied by Darrett. Denies any new seizure activity. Requesting a 90 day supply of divalproex 500 mg tablet.   INTERVAL HISTORY 04/13/22:  Patient presents today for follow-up, she is accompanied by her husband.  History mainly obtained by husband.  Husband states that patient was diagnosed with dementia 10 years ago, she is 100% dependent on her husband.  Her family history includes maternal grandfather and maternal grandmother with dementia, no one was diagnosed at early age.  She never was diagnosed with TBI, no stroke, no seizures, and no sleep apnea.  She did have a diagnosis of depression and anxiety after her initial diagnosis of Dementia. Since starting the Depakote, it seemed like her mood has improved.  Denies any additional seizures since last visit.   TBI:   No past history of TBI Stroke:   no past history of stroke Seizures:   no past history of seizures Sleep:   no history of sleep apnea.   Mood: Yes, after her diagnosis of dementia she had depression  Functional status: Dependent in all ADLs and IADLs Patient lives with husband and she is 100% dependent on him.   Cooking: No Cleaning: No Shopping: No Bathing: No, husband Toileting: No, husband Driving: No Bills: No Medications: Husband help with all of her medication Ever left the stove on by accident?:  Not applicable Forget how to use items around the house?:  Yes Getting lost going to familiar places?:  Yes Forgetting loved ones names?:  Yes is Word finding difficulty?  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  Husband reports symptoms started 2 years ago when she started having myoclonic jerks mainly in the morning.  On June 23 during her sleep patient had full body convulsion lasting about 30 seconds, husband's report after the event patient could not wake and it was very hard to control her body.  EMS was called and patient was taken to the ED.  Husband denies any tongue biting, denies any urinary incontinence and no injuries.  In the ED, she did have a head CT which showed generalized brain atrophy but no other abnormality, and patient was started on Keppra 500 mg twice daily.  Husband reports since starting Keppra patient has been very lethargic.    OTHER MEDICAL CONDITIONS: Moderate to severe Dementia, hypertension, hyperlipidemia   REVIEW OF SYSTEMS: Full 14 system review of systems performed and negative with exception of: Unable to fully obtain   ALLERGIES: Allergies  Allergen Reactions   Ciprofloxacin    Elemental Sulfur    Penicillins    Sulfa Antibiotics    Tetanus-Diphth-Acell Pertussis     HOME MEDICATIONS: Outpatient Medications Prior to Visit  Medication Sig Dispense Refill   aspirin 325 MG tablet Take 325 mg by mouth daily.     atorvastatin (LIPITOR) 10 MG tablet Take 1 tablet (10 mg total) by mouth daily. 90 tablet 3   benazepril (LOTENSIN) 20 MG tablet TAKE 1 TABLET(20 MG) BY MOUTH DAILY FOR BLOOD PRESSURE (Patient  taking differently: Take 20 mg by mouth daily.) 90 tablet 2   cholecalciferol (VITAMIN D) 25 MCG (1000 UNIT) tablet Take 400 Units by mouth daily.     divalproex (DEPAKOTE ER) 250 MG 24 hr tablet Take 1 tablet (250 mg total) by mouth daily. To take with an additional 500 mg for a total of 750 mg 30 tablet 11   DULoxetine (CYMBALTA) 60 MG capsule TAKE 1 CAPSULE(60 MG) BY MOUTH DAILY FOR ANXIETY (Patient taking differently: Take 60 mg by  mouth daily.) 90 capsule 2   levothyroxine (SYNTHROID) 125 MCG tablet TAKE ONE TABLET BY MOUTH EVERY MORNING ON AN EMPTY STOMACH WITH WATER ONLY.. NO FOOD OR OTHER MEDICATION FOR 30 MINUTES (Patient taking differently: Take 125 mcg by mouth daily before breakfast.) 90 tablet 3   Omega-3 Fatty Acids (FISH OIL) 1000 MG CAPS Take 1,000 mg by mouth daily.     divalproex (DEPAKOTE ER) 500 MG 24 hr tablet TAKE 1 TABLET(500 MG) BY MOUTH DAILY 30 tablet 3   memantine (NAMENDA) 5 MG tablet Take 1 tablet by mouth every morning and 2 tablets by mouth every evening FOR DEMENTIA. (Patient taking differently: Take 5-10 mg by mouth See admin instructions. 5 mg in the morning  10 mg at bedtime) 270 tablet 3   No facility-administered medications prior to visit.    PAST MEDICAL HISTORY: Past Medical History:  Diagnosis Date   Anxiety    Dementia ([place])    Depression    Diverticulitis    Essential hypertension    Hallucinations    High cholesterol    Hypothyroidism    Osteoarthritis     PAST SURGICAL HISTORY: Past Surgical History:  Procedure Laterality Date   BREAST BIOPSY Right 12/27/2019   distortion, ribbon, discordant   COLON SURGERY     Secondary to acute diverticulitis    COLONOSCOPY     COLONOSCOPY WITH PROPOFOL N/A 07/16/2021   Procedure: COLONOSCOPY WITH PROPOFOL;  Surgeon: Lin Landsman, MD;  Location: [facility] ENDOSCOPY;  Service: Gastroenterology;  Laterality: N/A;  PATIENT HAS DEMENTIA  HUSBAND IS POA - TO COME TO ENDO WITH PATIENT   THYROIDECTOMY      FAMILY HISTORY: Family History  Problem Relation Age of Onset   Arthritis Mother    Diabetes Mother    Breast cancer Mother        46's   Diabetes Father    Heart attack Father     SOCIAL HISTORY: Social History   Socioeconomic History   Marital status: Married    Spouse name: Not on file   Number of children: 1   Years of education: College   Highest education level: Not on file  Occupational History   Not on  file  Tobacco Use   Smoking status: Never   Smokeless tobacco: Never  Substance and Sexual Activity   Alcohol use: Not Currently   Drug use: Never   Sexual activity: Not on file  Other Topics Concern   Not on file  Social History Narrative   Patient is retired.   Lives at home with her husband.   Drinks 3 cups of coffee daily.   Social Determinants of Health   Financial Resource Strain: Not on file  Food Insecurity: Not on file  Transportation Needs: Not on file  Physical Activity: Not on file  Stress: Not on file  Social Connections: Not on file  Intimate Partner Violence: Not on file    PHYSICAL EXAM  GENERAL EXAM/CONSTITUTIONAL: Vitals:  Vitals:   04/13/22 1444  BP: 100/68  Pulse: 84  Weight: 203 lb (92.1 kg)  Height: 5\' 7"  (1.702 m)    Body mass index is 31.79 kg/m. Wt Readings from Last 3 Encounters:  04/13/22 203 lb (92.1 kg)  03/04/22 206 lb 8 oz (93.7 kg)  12/01/21 207 lb (93.9 kg)   Patient is in no distress; well developed, nourished and groomed; neck is supple. Dependent on husband. She could not complete MOCA, could not hold a pen   EYES: Pupils round and reactive to light, Visual fields full to confrontation, Extraocular movements intacts,  No results found.  MUSCULOSKELETAL: Gait, strength, tone, movements noted in Neurologic exam below  NEUROLOGIC: MENTAL STATUS:      No data to display         Awake, oriented to person but not place or time.  Unable to tell me her date of birth.  Unable to recall the ED visit.  Husband states that patient cannot remember what happened now ago. For floor equal round and reactive, unable to count count digits, unable to identify object even when given to her hands.  Face is symmetric.  Full strength in upper and lower extremity.  Need husband for ambulation as she is holding to husband.     DIAGNOSTIC DATA (LABS, IMAGING, TESTING) - I reviewed patient records, labs, notes, testing and imaging myself  where available.  Lab Results  Component Value Date   WBC 5.9 02/05/2022   HGB 15.6 (H) 02/05/2022   HCT 46.8 (H) 02/05/2022   MCV 93.0 02/05/2022   PLT 178 02/05/2022      Component Value Date/Time   NA 136 02/05/2022 0257   K 3.8 02/05/2022 0257   CL 103 02/05/2022 0257   CO2 21 (L) 02/05/2022 0257   GLUCOSE 114 (H) 02/05/2022 0257   BUN 19 02/05/2022 0257   CREATININE 0.95 02/05/2022 0257   CALCIUM 8.7 (L) 02/05/2022 0257   PROT 6.5 02/05/2022 0257   ALBUMIN 3.4 (L) 02/05/2022 0257   AST 27 02/05/2022 0257   ALT 17 02/05/2022 0257   ALKPHOS 73 02/05/2022 0257   BILITOT 0.8 02/05/2022 0257   GFRNONAA >60 02/05/2022 0257   Lab Results  Component Value Date   CHOL 196 12/01/2021   HDL 49.60 12/01/2021   LDLCALC 122 (H) 12/01/2021   TRIG 124.0 12/01/2021   No results found for: "HGBA1C" No results found for: "VITAMINB12" Lab Results  Component Value Date   TSH 3.15 12/01/2021    Head CT 02/05/22 1. No acute finding. No focal cortical finding to correlate with the history. 2. Brain atrophy   EEG 03/24/22: Mild diffuse slowing    ASSESSMENT AND PLAN  71 y.o. year old female  with history of moderate to severe dementia, hypertension, hyperlipidemia, anxiety depression, who is presenting for follow up for her seizure and dementia.  In terms of the seizures, no seizure or myoclonic jerks since starting Depakote 2550 mg at bedtime, she is tolerating this medication better denies any increased daytime somnolence For her dementia, patient likely have early onset severe dementia, likely Alzheimer.  I will maximize her Namenda to 10 mg twice daily and continue her on all other medication.  Initially she did not tolerate the Aricept tablet due to diarrhea, will prescribe the patch.  We also discussed home genetic testing since patient was diagnosed with early onset Alzheimer's disease, they will discuss it as a family and if interested they will contact 80 to  possibly test  patient and if she does not carry 1 of these gene responsible for early onset Alzheimer disease then daughter will be tested.  Continue to follow with PCP and return in 1 year or sooner if worse.    Husband reports for the past 2 years patient has been having myoclonic jerks.  After the seizure she was started on Keppra, no additional myoclonic jerks but patient is very lethargic.  Due to lethargy, I will switch her Keppra to Depakote 750 mg extended release daily.  Her liver function test was normal. In regards of the dementia, husband reports that patient has not seen a neurologist for more than 10 years.  She is totally dependent on him.  She does have hallucinations, and also having difficulty identifying objects.  Husband reported that patient has 2 cats that she loves dearly but sometimes she acts like she cannot see the cats when the cats are sitting on her lap and other time she is playing with the cats when there are no cats around.  I advised her to come back in a month for formal dementia work-up.  They are comfortable with plan.   1. Seizure disorder (HCC)   2. 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  Other recommendations include using caution when using heavy equipment or power tools. Avoid working on ladders or at heights. Take showers instead of baths.  Do not swim alone.  Ensure the water temperature is not too high on  the home water heater. Do not go swimming alone. Do not lock yourself in a room alone (i.e. bathroom). When caring for infants or small children, sit down when holding, feeding, or changing them to minimize risk of injury to the child in the event you have a seizure. Maintain good sleep hygiene. Avoid alcohol.  Also recommend adequate sleep, hydration, good diet and minimize stress.   During the Seizure  - First, ensure adequate ventilation and place patients on the floor on their left side  Loosen clothing around the neck and ensure the airway is patent. If the patient is clenching the teeth, do not force the mouth open with any object as this can cause severe damage - Remove all items from the surrounding that can be hazardous. The patient may be oblivious to what's happening and may not even know what he or she is doing. If the patient is confused and wandering, either gently guide him/her away and block access to outside areas - Reassure the individual and be comforting - Call 911. In most cases, the seizure ends before EMS arrives. However, there are cases when seizures may last over 3 to 5 minutes. Or the individual may have developed breathing difficulties or severe injuries. If a pregnant patient or a person with diabetes develops a seizure, it is prudent to call an ambulance. - Finally, if the  patient does not regain full consciousness, then call EMS. Most patients will remain confused for  about 45 to 90 minutes after a seizure, so you must use judgment in calling for help. - Avoid restraints but make sure the patient is in a bed with padded side rails - Place the individual in a lateral position with the neck slightly flexed; this will help the saliva drain from the mouth and prevent the tongue from falling backward - Remove all nearby furniture and other hazards from the area - Provide verbal assurance as the individual is regaining consciousness - Provide the patient with privacy if  possible - Call for help and start treatment as ordered by the caregiver   After the Seizure (Postictal Stage)  After a seizure, most patients experience confusion, fatigue, muscle pain and/or a headache. Thus, one should permit the individual to sleep. For the next few days, reassurance is essential. Being calm and helping reorient the person is also of importance.  Most seizures are painless and end spontaneously. Seizures are not harmful to others but can lead to complications such as stress on the lungs, brain and the heart. Individuals with prior lung problems may develop labored breathing and respiratory distress.     No orders of the defined types were placed in this encounter.   Meds ordered this encounter  Medications   divalproex (DEPAKOTE ER) 500 MG 24 hr tablet    Sig: Take 1 tablet (500 mg total) by mouth daily.    Dispense:  90 tablet    Refill:  3   memantine (NAMENDA) 10 MG tablet    Sig: Take 1 tablet (10 mg total) by mouth 2 (two) times daily.    Dispense:  180 tablet    Refill:  3   Donepezil HCl (ADLARITY) 10 MG/DAY PTWK    Sig: Place 10 mg onto the skin once a week for 28 days.    Dispense:  4 patch    Refill:  12    Return in about 1 year (around 04/14/2023).  I have spent a total of 45 minutes dedicated to this patient today, preparing to see patient, performing a medically appropriate examination and evaluation, ordering tests and/or medications and procedures, and counseling and educating the patient/family/caregiver; independently interpreting result and communicating results to the family/patient/caregiver; and documenting clinical information in the electronic medical record.   Windell Norfolk, MD 04/13/2022, 6:32 PM  [facility] [address], [address] [place], [place] [address] [phone]

## 2022-04-15 ENCOUNTER — Telehealth: Payer: Self-pay

## 2022-04-15 NOTE — Telephone Encounter (Signed)
PA for Adlarity has been sent via cmm (Key: [id number])  Your information has been sent to [facility].

## 2022-06-11 ENCOUNTER — Telehealth: Payer: Self-pay | Admitting: Primary Care

## 2022-06-11 NOTE — Telephone Encounter (Signed)
Left message for patient to schedule Annual Wellness Visit.  Please schedule with Nurse Health Advisor Denisa O'Brien-Blaney, LPN.. This appt can be telephone.  Please call [phone] ask for Kathy  

## 2022-09-07 ENCOUNTER — Ambulatory Visit: Payer: Medicare Other

## 2022-09-24 ENCOUNTER — Encounter: Payer: Self-pay | Admitting: Primary Care

## 2022-09-26 ENCOUNTER — Encounter: Payer: Self-pay | Admitting: Neurology

## 2022-10-12 ENCOUNTER — Ambulatory Visit (INDEPENDENT_AMBULATORY_CARE_PROVIDER_SITE_OTHER): Payer: Medicare Other

## 2022-10-12 VITALS — Ht 67.0 in | Wt 203.0 lb

## 2022-10-12 DIAGNOSIS — Z0000 Encounter for general adult medical examination without abnormal findings: Secondary | ICD-10-CM

## 2022-10-12 DIAGNOSIS — Z1231 Encounter for screening mammogram for malignant neoplasm of breast: Secondary | ICD-10-CM

## 2022-10-12 NOTE — Progress Notes (Signed)
I connected with  Melanie Long and spouse Darrett Aguila on 10/12/22 by a audio enabled telemedicine application and verified that I am speaking with the correct person using two identifiers.  Patient Location: Home  Provider Location: Office/Clinic  I discussed the limitations of evaluation and management by telemedicine. The patient expressed understanding and agreed to proceed.  Subjective:   Melanie Long is a 72 y.o. female who presents for Medicare Annual (Subsequent) preventive examination.  Review of Systems     Cardiac Risk Factors include: hypertension;sedentary lifestyle     Objective:    Today's Vitals   10/12/22 1026  Weight: 203 lb (92.1 kg)  Height: '5\' 7"'$  (1.702 m)   Body mass index is 31.79 kg/m.     10/12/2022   10:42 AM 02/05/2022    2:53 AM 07/16/2021    9:34 AM  Advanced Directives  Does Patient Have a Medical Advance Directive? Yes Yes Yes  Type of Paramedic of [place];Living will [place] in Chart? No - copy requested Yes - validated most recent copy scanned in chart (See row information) No - copy requested    Current Medications (verified) Outpatient Encounter Medications as of 10/12/2022  Medication Sig   aspirin 325 MG tablet Take 325 mg by mouth daily.   atorvastatin (LIPITOR) 10 MG tablet Take 1 tablet (10 mg total) by mouth daily.   benazepril (LOTENSIN) 20 MG tablet TAKE 1 TABLET(20 MG) BY MOUTH DAILY FOR BLOOD PRESSURE (Patient taking differently: Take 20 mg by mouth daily.)   divalproex (DEPAKOTE ER) 250 MG 24 hr tablet Take 1 tablet (250 mg total) by mouth daily. To take with an additional 500 mg for a total of 750 mg   divalproex (DEPAKOTE ER) 500 MG 24 hr tablet Take 1 tablet (500 mg total) by mouth daily.   DULoxetine (CYMBALTA) 60 MG capsule TAKE 1 CAPSULE(60 MG) BY MOUTH DAILY FOR ANXIETY (Patient taking  differently: Take 60 mg by mouth daily.)   levothyroxine (SYNTHROID) 125 MCG tablet TAKE ONE TABLET BY MOUTH EVERY MORNING ON AN EMPTY STOMACH WITH WATER ONLY.. NO FOOD OR OTHER MEDICATION FOR 30 MINUTES (Patient taking differently: Take 125 mcg by mouth daily before breakfast.)   memantine (NAMENDA) 10 MG tablet Take 1 tablet (10 mg total) by mouth 2 (two) times daily.   Omega-3 Fatty Acids (FISH OIL) 1000 MG CAPS Take 1,000 mg by mouth daily.   cholecalciferol (VITAMIN D) 25 MCG (1000 UNIT) tablet Take 400 Units by mouth daily. (Patient not taking: Reported on 10/12/2022)   Donepezil HCl (ADLARITY) 10 MG/DAY PTWK Place 10 mg onto the skin once a week for 28 days.   No facility-administered encounter medications on file as of 10/12/2022.    Allergies (verified) Ciprofloxacin, Elemental sulfur, Penicillins, Sulfa antibiotics, and Tetanus-diphth-acell pertussis   History: Past Medical History:  Diagnosis Date   Anxiety    Dementia ([place])    Depression    Diverticulitis    Essential hypertension    Hallucinations    High cholesterol    Hypothyroidism    Osteoarthritis    Past Surgical History:  Procedure Laterality Date   BREAST BIOPSY Right 12/27/2019   distortion, ribbon, discordant   COLON SURGERY     Secondary to acute diverticulitis    COLONOSCOPY     COLONOSCOPY WITH PROPOFOL N/A 07/16/2021   Procedure: COLONOSCOPY WITH PROPOFOL;  Surgeon: Lin Landsman,  MD;  Location: [facility] ENDOSCOPY;  Service: Gastroenterology;  Laterality: N/A;  PATIENT HAS DEMENTIA  HUSBAND IS POA - TO COME TO ENDO WITH PATIENT   THYROIDECTOMY     Family History  Problem Relation Age of Onset   Arthritis Mother    Diabetes Mother    Breast cancer Mother        97's   Diabetes Father    Heart attack Father    Social History   Socioeconomic History   Marital status: Married    Spouse name: Not on file   Number of children: 1   Years of education: College   Highest education level: Not on  file  Occupational History   Not on file  Tobacco Use   Smoking status: Never   Smokeless tobacco: Never  Substance and Sexual Activity   Alcohol use: Not Currently   Drug use: Never   Sexual activity: Not on file  Other Topics Concern   Not on file  Social History Narrative   Patient is retired.   Lives at home with her husband.   Drinks 3 cups of coffee daily.   Social Determinants of Health   Financial Resource Strain: Low Risk  (10/12/2022)   Overall Financial Resource Strain (CARDIA)    Difficulty of Paying Living Expenses: Not hard at all  Food Insecurity: No Food Insecurity (10/12/2022)   Hunger Vital Sign    Worried About Running Out of Food in the Last Year: Never true    Ran Out of Food in the Last Year: Never true  Transportation Needs: No Transportation Needs (10/12/2022)   PRAPARE - Hydrologist (Medical): No    Lack of Transportation (Non-Medical): No  Physical Activity: Inactive (10/12/2022)   Exercise Vital Sign    Days of Exercise per Week: 0 days    Minutes of Exercise per Session: 0 min  Stress: No Stress Concern Present (10/12/2022)   [place]    Feeling of Stress : Not at all  Social Connections: Moderately Isolated (10/12/2022)   Social Connection and Isolation Panel [NHANES]    Frequency of Communication with Friends and Family: More than three times a week    Frequency of Social Gatherings with Friends and Family: More than three times a week    Attends Religious Services: Never    Marine scientist or Organizations: No    Attends Music therapist: Never    Marital Status: Married    Tobacco Counseling Counseling given: Not Answered   Clinical Intake:  Pre-visit preparation completed: Yes  Pain : No/denies pain     Nutritional Risks: None Diabetes: No  How often do you need to have someone help you when you read instructions,  pamphlets, or other written materials from your doctor or pharmacy?: 5 - Always (Husband assists)  Diabetic? no  Interpreter Needed?: No  Information entered by :: C.Pierina Schuknecht LPN   Activities of Daily Living    10/12/2022   10:45 AM  In your present state of health, do you have any difficulty performing the following activities:  Hearing? 0  Vision? 1  Comment Wears Glasses  Difficulty concentrating or making decisions? 1  Comment Husband assist  Walking or climbing stairs? 1  Comment husband assist  Dressing or bathing? 1  Comment husband assist  Doing errands, shopping? 1  Comment Husband assist  Preparing Food and eating ? Y  Comment Husband  assist  Using the Toilet? Y  In the past six months, have you accidently leaked urine? N  Do you have problems with loss of bowel control? N  Managing your Medications? Y  Managing your Finances? Y  Housekeeping or managing your Housekeeping? Y    Patient Care Team: Pleas Koch, NP as PCP - General (Internal Medicine)  Indicate any recent Medical Services you may have received from other than [facility] providers in the past year (date may be approximate).     Assessment:   This is a routine wellness examination for [place].  Hearing/Vision screen Hearing Screening - Comments:: No aids Vision Screening - Comments:: Wears glasses - [facility]   Dietary issues and exercise activities discussed: Current Exercise Habits: The patient does not participate in regular exercise at present, Exercise limited by: Other - see comments (mobility issues)   Goals Addressed   None    Depression Screen    10/12/2022   10:41 AM 12/01/2021    2:12 PM 11/21/2020   11:29 AM  PHQ 2/9 Scores  PHQ - 2 Score 0 0 0  PHQ- 9 Score  0 0    Fall Risk    10/12/2022   10:44 AM 12/01/2021    2:12 PM 11/21/2020   11:28 AM  Fall Risk   Falls in the past year? 1 0 1  Comment Tripped on curb    Number falls in past yr: 0 0 0  Injury with  Fall? 0  0  Risk for fall due to : Impaired balance/gait;Mental status change Impaired balance/gait;Impaired mobility Impaired balance/gait  Follow up Falls evaluation completed;Education provided;Falls prevention discussed Falls evaluation completed     FALL RISK PREVENTION PERTAINING TO THE HOME:  Any stairs in or around the home? Yes  If so, are there any without handrails? Yes  Home free of loose throw rugs in walkways, pet beds, electrical cords, etc? Yes  Adequate lighting in your home to reduce risk of falls? Yes   ASSISTIVE DEVICES UTILIZED TO PREVENT FALLS:  Life alert? No  Use of a cane, walker or w/c? No  Grab bars in the bathroom? Yes  Shower chair or bench in shower? Yes  Elevated toilet seat or a handicapped toilet? Yes    Cognitive Function:        10/12/2022   10:48 AM  6CIT Screen  What Year? 4 points  What month? 3 points  What time? 3 points  Count back from 20 4 points  Months in reverse 4 points  Repeat phrase 10 points  Total Score 28 points    Immunizations Immunization History  Administered Date(s) Administered   COVID-19, mRNA, vaccine(Comirnaty)12 years and older 08/18/2022   Influenza, High Dose Seasonal PF 07/28/2021   Influenza,inj,quad, With Preservative 06/30/2015, 07/18/2018   Influenza-Unspecified 07/01/2020, 08/18/2022   PFIZER(Purple Top)SARS-COV-2 Vaccination 09/21/2019, 10/12/2019, 07/01/2020   Pfizer Covid-19 Vaccine Bivalent Booster 70yr & up 07/28/2021   Pneumococcal Conjugate-13 07/13/2017   Pneumococcal Polysaccharide-23 07/18/2018   Pneumococcal-Unspecified 04/24/2008   Respiratory Syncytial Virus Vaccine,Recomb Aduvanted(Arexvy) 08/18/2022    TDAP status: Due, Education has been provided regarding the importance of this vaccine. Advised may receive this vaccine at local pharmacy or Health Dept. Aware to provide a copy of the vaccination record if obtained from local pharmacy or Health Dept. Verbalized acceptance and  understanding.  Flu Vaccine status: Up to date  Pneumococcal vaccine status: Up to date  Covid-19 vaccine status: Information provided on how to obtain  vaccines.   Qualifies for Shingles Vaccine? Yes   Zostavax completed No   Shingrix Completed?: Yes  Screening Tests Health Maintenance  Topic Date Due   Hepatitis C Screening  Never done   DTaP/Tdap/Td (1 - Tdap) Never done   Zoster Vaccines- Shingrix (1 of 2) Never done   DEXA SCAN  Never done   MAMMOGRAM  08/05/2022   Medicare Annual Wellness (AWV)  10/13/2023   COLONOSCOPY (Pts 45-46yr Insurance coverage will need to be confirmed)  07/16/2024   Pneumonia Vaccine 70 Years old  Completed   INFLUENZA VACCINE  Completed   COVID-19 Vaccine  Completed   HPV VACCINES  Aged Out    Health Maintenance  Health Maintenance Due  Topic Date Due   Hepatitis C Screening  Never done   DTaP/Tdap/Td (1 - Tdap) Never done   Zoster Vaccines- Shingrix (1 of 2) Never done   DEXA SCAN  Never done   MAMMOGRAM  08/05/2022    Colorectal cancer screening: Type of screening: Colonoscopy. Completed 07/16/2021. Repeat every 3 years  Mammogram status: Ordered 10/12/2022. Pt provided with contact info and advised to call to schedule appt.   Bone Density declined.  Lung Cancer Screening: (Low Dose CT Chest recommended if Age 70-80years, 30 pack-year currently smoking OR have quit w/in 15years.) does not qualify.   Lung Cancer Screening Referral: no  Additional Screening:  Hepatitis C Screening: does qualify; Completed not done  Vision Screening: Recommended annual ophthalmology exams for early detection of glaucoma and other disorders of the eye. Is the patient up to date with their annual eye exam?  Yes  Who is the provider or what is the name of the office in which the patient attends annual eye exams? [facility] WarnerIf pt is not established with a provider, would they like to be referred to a provider to establish care? No .    Dental Screening: Recommended annual dental exams for proper oral hygiene  Community Resource Referral / Chronic Care Management: CRR required this visit?  No   CCM required this visit?  No      Plan:     I have personally reviewed and noted the following in the patient's chart:   Medical and social history Use of alcohol, tobacco or illicit drugs  Current medications and supplements including opioid prescriptions. Patient is not currently taking opioid prescriptions. Functional ability and status Nutritional status Physical activity Advanced directives List of other physicians Hospitalizations, surgeries, and ER visits in previous 12 months Vitals Screenings to include cognitive, depression, and falls Referrals and appointments  In addition, I have reviewed and discussed with patient certain preventive protocols, quality metrics, and best practice recommendations. A written personalized care plan for preventive services as well as general preventive health recommendations were provided to patient.     CLebron Conners LPN   [id number]  Nurse Notes: Order placed for annual mammogram. Husband concerned about pt's restlessness and would like to discuss a medication for anxiety with PCP. Husband states will call for appointment.Pt is dependent on husband to assist with all ADL's and home management.

## 2022-10-12 NOTE — Patient Instructions (Signed)
Melanie Long , Thank you for taking time to come for your Medicare Wellness Visit. I appreciate your ongoing commitment to your health goals. Please review the following plan we discussed and let me know if I can assist you in the future.   These are the goals we discussed:  Goals   None     This is a list of the screening recommended for you and due dates:  Health Maintenance  Topic Date Due   Hepatitis C Screening: USPSTF Recommendation to screen - Ages 76-79 yo.  Never done   DTaP/Tdap/Td vaccine (1 - Tdap) Never done   Zoster (Shingles) Vaccine (1 of 2) Never done   DEXA scan (bone density measurement)  Never done   Mammogram  08/05/2022   Medicare Annual Wellness Visit  10/13/2023   Colon Cancer Screening  07/16/2024   Pneumonia Vaccine  Completed   Flu Shot  Completed   COVID-19 Vaccine  Completed   HPV Vaccine  Aged Out    Advanced directives: Please bring a copy of your health care power of attorney and living will to the office to be added to your chart at your convenience.   Conditions/risks identified: Aim for 30 minutes of exercise or brisk walking, 6-8 glasses of water, and 5 servings of fruits and vegetables each day.   Next appointment: Follow up in one year for your annual wellness visit 10/18/2023 @ 10:00 via telephone   Preventive Care 65 Years and Older, Female Preventive care refers to lifestyle choices and visits with your health care provider that can promote health and wellness. What does preventive care include? A yearly physical exam. This is also called an annual well check. Dental exams once or twice a year. Routine eye exams. Ask your health care provider how often you should have your eyes checked. Personal lifestyle choices, including: Daily care of your teeth and gums. Regular physical activity. Eating a healthy diet. Avoiding tobacco and drug use. Limiting alcohol use. Practicing safe sex. Taking low-dose aspirin every day. Taking vitamin  and mineral supplements as recommended by your health care provider. What happens during an annual well check? The services and screenings done by your health care provider during your annual well check will depend on your age, overall health, lifestyle risk factors, and family history of disease. Counseling  Your health care provider may ask you questions about your: Alcohol use. Tobacco use. Drug use. Emotional well-being. Home and relationship well-being. Sexual activity. Eating habits. History of falls. Memory and ability to understand (cognition). Work and work Statistician. Reproductive health. Screening  You may have the following tests or measurements: Height, weight, and BMI. Blood pressure. Lipid and cholesterol levels. These may be checked every 5 years, or more frequently if you are over 11 years old. Skin check. Lung cancer screening. You may have this screening every year starting at age 2 if you have a 30-pack-year history of smoking and currently smoke or have quit within the past 15 years. Fecal occult blood test (FOBT) of the stool. You may have this test every year starting at age 1. Flexible sigmoidoscopy or colonoscopy. You may have a sigmoidoscopy every 5 years or a colonoscopy every 10 years starting at age 90. Hepatitis C blood test. Hepatitis B blood test. Sexually transmitted disease (STD) testing. Diabetes screening. This is done by checking your blood sugar (glucose) after you have not eaten for a while (fasting). You may have this done every 1-3 years. Bone density scan. This is  done to screen for osteoporosis. You may have this done starting at age 51. Mammogram. This may be done every 1-2 years. Talk to your health care provider about how often you should have regular mammograms. Talk with your health care provider about your test results, treatment options, and if necessary, the need for more tests. Vaccines  Your health care provider may recommend  certain vaccines, such as: Influenza vaccine. This is recommended every year. Tetanus, diphtheria, and acellular pertussis (Tdap, Td) vaccine. You may need a Td booster every 10 years. Zoster vaccine. You may need this after age 33. Pneumococcal 13-valent conjugate (PCV13) vaccine. One dose is recommended after age 65. Pneumococcal polysaccharide (PPSV23) vaccine. One dose is recommended after age 13. Talk to your health care provider about which screenings and vaccines you need and how often you need them. This information is not intended to replace advice given to you by your health care provider. Make sure you discuss any questions you have with your health care provider. Document Released: 08/29/2015 Document Revised: 04/21/2016 Document Reviewed: 06/03/2015 Elsevier Interactive Patient Education  2017 [place] Prevention in the Home Falls can cause injuries. They can happen to people of all ages. There are many things you can do to make your home safe and to help prevent falls. What can I do on the outside of my home? Regularly fix the edges of walkways and driveways and fix any cracks. Remove anything that might make you trip as you walk through a door, such as a raised step or threshold. Trim any bushes or trees on the path to your home. Use bright outdoor lighting. Clear any walking paths of anything that might make someone trip, such as rocks or tools. Regularly check to see if handrails are loose or broken. Make sure that both sides of any steps have handrails. Any raised decks and porches should have guardrails on the edges. Have any leaves, snow, or ice cleared regularly. Use sand or salt on walking paths during winter. Clean up any spills in your garage right away. This includes oil or grease spills. What can I do in the bathroom? Use night lights. Install grab bars by the toilet and in the tub and shower. Do not use towel bars as grab bars. Use non-skid mats or  decals in the tub or shower. If you need to sit down in the shower, use a plastic, non-slip stool. Keep the floor dry. Clean up any water that spills on the floor as soon as it happens. Remove soap buildup in the tub or shower regularly. Attach bath mats securely with double-sided non-slip rug tape. Do not have throw rugs and other things on the floor that can make you trip. What can I do in the bedroom? Use night lights. Make sure that you have a light by your bed that is easy to reach. Do not use any sheets or blankets that are too big for your bed. They should not hang down onto the floor. Have a firm chair that has side arms. You can use this for support while you get dressed. Do not have throw rugs and other things on the floor that can make you trip. What can I do in the kitchen? Clean up any spills right away. Avoid walking on wet floors. Keep items that you use a lot in easy-to-reach places. If you need to reach something above you, use a strong step stool that has a grab bar. Keep electrical cords out of  the way. Do not use floor polish or wax that makes floors slippery. If you must use wax, use non-skid floor wax. Do not have throw rugs and other things on the floor that can make you trip. What can I do with my stairs? Do not leave any items on the stairs. Make sure that there are handrails on both sides of the stairs and use them. Fix handrails that are broken or loose. Make sure that handrails are as long as the stairways. Check any carpeting to make sure that it is firmly attached to the stairs. Fix any carpet that is loose or worn. Avoid having throw rugs at the top or bottom of the stairs. If you do have throw rugs, attach them to the floor with carpet tape. Make sure that you have a light switch at the top of the stairs and the bottom of the stairs. If you do not have them, ask someone to add them for you. What else can I do to help prevent falls? Wear shoes that: Do not  have high heels. Have rubber bottoms. Are comfortable and fit you well. Are closed at the toe. Do not wear sandals. If you use a stepladder: Make sure that it is fully opened. Do not climb a closed stepladder. Make sure that both sides of the stepladder are locked into place. Ask someone to hold it for you, if possible. Clearly mark and make sure that you can see: Any grab bars or handrails. First and last steps. Where the edge of each step is. Use tools that help you move around (mobility aids) if they are needed. These include: Canes. Walkers. Scooters. Crutches. Turn on the lights when you go into a dark area. Replace any light bulbs as soon as they burn out. Set up your furniture so you have a clear path. Avoid moving your furniture around. If any of your floors are uneven, fix them. If there are any pets around you, be aware of where they are. Review your medicines with your doctor. Some medicines can make you feel dizzy. This can increase your chance of falling. Ask your doctor what other things that you can do to help prevent falls. This information is not intended to replace advice given to you by your health care provider. Make sure you discuss any questions you have with your health care provider. Document Released: 05/29/2009 Document Revised: 01/08/2016 Document Reviewed: 09/06/2014 Elsevier Interactive Patient Education  2017 [employer].

## 2022-12-02 ENCOUNTER — Ambulatory Visit (INDEPENDENT_AMBULATORY_CARE_PROVIDER_SITE_OTHER): Payer: Medicare Other | Admitting: Primary Care

## 2022-12-02 VITALS — BP 110/62 | HR 92 | Temp 97.6°F | Ht 67.0 in | Wt 198.0 lb

## 2022-12-02 DIAGNOSIS — H6122 Impacted cerumen, left ear: Secondary | ICD-10-CM

## 2022-12-02 DIAGNOSIS — E890 Postprocedural hypothyroidism: Secondary | ICD-10-CM

## 2022-12-02 DIAGNOSIS — F03B4 Unspecified dementia, moderate, with anxiety: Secondary | ICD-10-CM

## 2022-12-02 DIAGNOSIS — I10 Essential (primary) hypertension: Secondary | ICD-10-CM

## 2022-12-02 DIAGNOSIS — E2839 Other primary ovarian failure: Secondary | ICD-10-CM | POA: Diagnosis not present

## 2022-12-02 DIAGNOSIS — E782 Mixed hyperlipidemia: Secondary | ICD-10-CM | POA: Diagnosis not present

## 2022-12-02 DIAGNOSIS — Z1159 Encounter for screening for other viral diseases: Secondary | ICD-10-CM

## 2022-12-02 DIAGNOSIS — Z803 Family history of malignant neoplasm of breast: Secondary | ICD-10-CM

## 2022-12-02 MED ORDER — CITALOPRAM HYDROBROMIDE 20 MG PO TABS: 20.0000 mg | ORAL_TABLET | Freq: Every day | ORAL | 0 refills | Status: DC

## 2022-12-02 MED ORDER — TRAZODONE HCL 50 MG PO TABS: 50.0000 mg | ORAL_TABLET | Freq: Every day | ORAL | 0 refills | Status: DC

## 2022-12-02 NOTE — Assessment & Plan Note (Signed)
Discussed to move vitamin D to 4 hours after levothyroxine.  Repeat TSH pending.  Continue levothyroxine 125 mcg.

## 2022-12-02 NOTE — Assessment & Plan Note (Signed)
Left cerumen impaction identified on exam. Patient and husband consented to irrigation of canals bilaterally.  Left canal irrigated. Patient tolerated well. TM's and canals post irrigation unremarkable.   Discussed home care instructions.

## 2022-12-02 NOTE — Progress Notes (Signed)
Subjective:    Patient ID: Melanie Long, female    DOB: 07-Aug-1951, 72 y.o.   MRN: [id number]  Hypertension Pertinent negatives include no chest pain or shortness of breath.    Melanie Long is a very pleasant 72 y.o. female with a history of hypertension, hypothyroidism, osteoarthritis, dementia who presents today for follow-up of chronic conditions.  Her husband joins [place] today who provides information for HPI as she is noncontributory.   Immunizations: -Influenza: Completed this season -Shingles: Never completed  -Pneumonia: Completed Prevnar 13 in 2018, Pneumovax 23 in 2019  Mammogram: December 2022 Bone Density Scan: No recent scan on file  Colonoscopy: Completed in 2022, due 2025   1) Dementia/Seizure Disorder: Currently following with neurology and is managed on Depakote ER 750 mg daily, Namenda 10 mg twice daily, donepezil 10 mg patch once weekly.  Last office visit with neurology was in August 2023.   She is no longer taking Donepezil patch as she could not tolerate. No prior seizure history until June 2023, ED visit. She does have occasional jerking motions which last a few seconds. No full seizures since June.   She is also managed on Cymbalta 60 mg daily for anxiety but her husband believes this is ineffective. She has never tried anything else for her symptoms. Symptoms include restlessness, walking around the house, constantly moving, wakes during the night not knowing where she is, hallucinating. He asked her neurologist but they declined to treat.  2) Hypertension/Hyperlipidemia: Currently managed on benazepril 20 mg daily and atorvastatin 10 mg daily.  She is due for repeat lipid panel today.  BP Readings from Last 3 Encounters:  12/02/22 110/62  04/13/22 100/68  03/04/22 125/74   3) Hypothyroidism: Postoperative.  Currently managed on levothyroxine 125 mcg tablets.  She takes her levothyroxine every morning on an empty stomach with water only.  No food or  other medication for 30 minutes.  No iron pills, magnesium, heartburn medicine within 4 hours. She does take vitamin D in the morning.  She is due for repeat TSH today.       Review of Systems  Respiratory:  Negative for shortness of breath.   Cardiovascular:  Negative for chest pain.  Gastrointestinal:  Negative for constipation and diarrhea.  Psychiatric/Behavioral:  Positive for sleep disturbance. The patient is nervous/anxious.          Past Medical History:  Diagnosis Date   Anxiety    Dementia (HCC)    Depression    Diverticulitis    Essential hypertension    Hallucinations    High cholesterol    Hypothyroidism    Osteoarthritis     Social History   Socioeconomic History   Marital status: Married    Spouse name: Not on file   Number of children: 1   Years of education: College   Highest education level: Not on file  Occupational History   Not on file  Tobacco Use   Smoking status: Never   Smokeless tobacco: Never  Substance and Sexual Activity   Alcohol use: Not Currently   Drug use: Never   Sexual activity: Not on file  Other Topics Concern   Not on file  Social History Narrative   Patient is retired.   Lives at home with her husband.   Drinks 3 cups of coffee daily.   Social Determinants of Health   Financial Resource Strain: Low Risk  (10/12/2022)   Overall Financial Resource Strain (CARDIA)    Difficulty of Paying  Living Expenses: Not hard at all  Food Insecurity: No Food Insecurity (10/12/2022)   Hunger Vital Sign    Worried About Running Out of Food in the Last Year: Never true    Ran Out of Food in the Last Year: Never true  Transportation Needs: No Transportation Needs (10/12/2022)   PRAPARE - Administrator, Civil Service (Medical): No    Lack of Transportation (Non-Medical): No  Physical Activity: Inactive (10/12/2022)   Exercise Vital Sign    Days of Exercise per Week: 0 days    Minutes of Exercise per Session: 0 min   Stress: No Stress Concern Present (10/12/2022)   [employer] of Occupational Health - Occupational Stress Questionnaire    Feeling of Stress : Not at all  Social Connections: Moderately Isolated (10/12/2022)   Social Connection and Isolation Panel [NHANES]    Frequency of Communication with Friends and Family: More than three times a week    Frequency of Social Gatherings with Friends and Family: More than three times a week    Attends Religious Services: Never    Database administrator or Organizations: No    Attends Banker Meetings: Never    Marital Status: Married  Catering manager Violence: Not At Risk (10/12/2022)   Humiliation, Afraid, Rape, and Kick questionnaire    Fear of Current or Ex-Partner: No    Emotionally Abused: No    Physically Abused: No    Sexually Abused: No    Past Surgical History:  Procedure Laterality Date   BREAST BIOPSY Right 12/27/2019   distortion, ribbon, discordant   COLON SURGERY     Secondary to acute diverticulitis    COLONOSCOPY     COLONOSCOPY WITH PROPOFOL N/A 07/16/2021   Procedure: COLONOSCOPY WITH PROPOFOL;  Surgeon: Toney Reil, MD;  Location: [facility] END[facility]COPY;  Service: Gastroenterology;  Laterality: N/A;  PATIENT HAS DEMENTIA  HUSBAND IS POA - TO COME TO ENDO WITH PATIENT   THYROIDECTOMY      Family History  Problem Relation Age of Onset   Arthritis Mother    Diabetes Mother    Breast cancer Mother        29's   Diabetes Father    Heart attack Father     Allergies  Allergen Reactions   Ciprofloxacin    Elemental Sulfur    Penicillins    Sulfa Antibiotics    Tetanus-Diphth-Acell Pertussis     Current Outpatient Medications on File Prior to Visit  Medication Sig Dispense Refill   aspirin 325 MG tablet Take 325 mg by mouth daily.     atorvastatin (LIPITOR) 10 MG tablet Take 1 tablet (10 mg total) by mouth daily. 90 tablet 3   benazepril (LOTENSIN) 20 MG tablet TAKE 1 TABLET(20 MG) BY MOUTH DAILY  FOR BLOOD PRESSURE (Patient taking differently: Take 20 mg by mouth daily.) 90 tablet 2   divalproex (DEPAKOTE ER) 250 MG 24 hr tablet Take 1 tablet (250 mg total) by mouth daily. To take with an additional 500 mg for a total of 750 mg 30 tablet 11   divalproex (DEPAKOTE ER) 500 MG 24 hr tablet Take 1 tablet (500 mg total) by mouth daily. 90 tablet 3   levothyroxine (SYNTHROID) 125 MCG tablet TAKE ONE TABLET BY MOUTH EVERY MORNING ON AN EMPTY STOMACH WITH WATER ONLY.. NO FOOD OR OTHER MEDICATION FOR 30 MINUTES (Patient taking differently: Take 125 mcg by mouth daily before breakfast.) 90 tablet 3   memantine (  NAMENDA) 10 MG tablet Take 1 tablet (10 mg total) by mouth 2 (two) times daily. 180 tablet 3   Omega-3 Fatty Acids (FISH OIL) 1000 MG CAPS Take 1,000 mg by mouth daily.     cholecalciferol (VITAMIN D) 25 MCG (1000 UNIT) tablet Take 400 Units by mouth daily. (Patient not taking: Reported on 10/12/2022)     Donepezil HCl (ADLARITY) 10 MG/DAY PTWK Place 10 mg onto the skin once a week for 28 days. (Patient not taking: Reported on 12/02/2022) 4 patch 12   No current facility-administered medications on file prior to visit.    BP 110/62   Pulse 92   Temp 97.6 F (36.4 C) (Temporal)   Ht  (1.702 m)   Wt 198 lb (89.8 kg)   SpO2 98%   BMI 31.01 kg/m  Objective:   Physical Exam HENT:     Right Ear: Tympanic membrane and ear canal normal.     Left Ear: There is impacted cerumen.     Nose: Nose normal.  Eyes:     Conjunctiva/sclera: Conjunctivae normal.  Neck:     Thyroid: No thyromegaly.  Cardiovascular:     Rate and Rhythm: Normal rate and regular rhythm.     Heart sounds: No murmur heard. Pulmonary:     Effort: Pulmonary effort is normal.     Breath sounds: Normal breath sounds. No rales.  Abdominal:     General: Bowel sounds are normal.     Palpations: Abdomen is soft.     Tenderness: There is no abdominal tenderness.  Musculoskeletal:     Cervical back: Neck supple.   Lymphadenopathy:     Cervical: No cervical adenopathy.  Skin:    General: Skin is warm and dry.     Findings: No rash.  Neurological:     Mental Status: She is alert.     Cranial Nerves: No cranial nerve deficit.     Deep Tendon Reflexes: Reflexes are normal and symmetric.     Comments: Follows some commands, does not participate in HPI  Psychiatric:        Mood and Affect: Mood normal.           Assessment & Plan:  Moderate dementia with anxiety, unspecified dementia type Assessment & Plan: Deteriorated, especially with anxiety during the late afternoon and night.  Stop duloxetine 60 mg. Start citalopram 20 mg  Start Trazodone 50 mg HS for sleep, wait 1-2 weeks after initiation of citalopram.  Instructions provided to patient's husband.  Orders: -     Citalopram Hydrobromide; Take 1 tablet (20 mg total) by mouth daily. For anxiety  Dispense: 90 tablet; Refill: 0 -     traZODone HCl; Take 1 tablet (50 mg total) by mouth at bedtime. For sleep  Dispense: 90 tablet; Refill: 0  Estrogen deficiency -     DG Bone Density; Future  Essential hypertension Assessment & Plan: Controlled.  Continue benazepril 20 mg daily.  CMP pending.  Orders: -     Comprehensive metabolic panel  Postoperative hypothyroidism Assessment & Plan: Discussed to move vitamin D to 4 hours after levothyroxine.  Repeat TSH pending.  Continue levothyroxine 125 mcg.  Orders: -     TSH  Impacted cerumen of left ear Assessment & Plan: Left cerumen impaction identified on exam. Patient and husband consented to irrigation of canals bilaterally.  Left canal irrigated. Patient tolerated well. TM's and canals post irrigation unremarkable.   Discussed home care instructions.  Family history of breast cancer in mother Assessment & Plan: Mammogram ordered and pending. Husband will schedule.    Encounter for hepatitis C screening test for low risk patient -     Hepatitis C  antibody  Mixed hyperlipidemia Assessment & Plan: Continue atorvastatin 10 mg daily. Repeat lipid panel pending.  Orders: -     Lipid panel        Doreene Nest, NP

## 2022-12-02 NOTE — Assessment & Plan Note (Signed)
Continue atorvastatin 10 mg daily. Repeat lipid panel pending.  

## 2022-12-02 NOTE — Assessment & Plan Note (Signed)
Mammogram ordered and pending. Husband will schedule.

## 2022-12-02 NOTE — Assessment & Plan Note (Signed)
Controlled.  Continue benazepril 20 mg daily.  CMP pending.

## 2022-12-02 NOTE — Assessment & Plan Note (Signed)
Deteriorated, especially with anxiety during the late afternoon and night.  Stop duloxetine 60 mg. Start citalopram 20 mg  Start Trazodone 50 mg HS for sleep, wait 1-2 weeks after initiation of citalopram.  Instructions provided to patient's husband.

## 2022-12-02 NOTE — Patient Instructions (Signed)
Stop by the lab prior to leaving today. I will notify you of your results once received.   [facility] and bone density scan.   Stop taking duloxetine (Cymbalta) for anxiety/depression.  Start taking citalopram 20 mg daily for anxiety and depression.  Wait 1-2 weeks, then start Trazodone 50 mg at bedtime for sleep.  Please update me!

## 2022-12-03 ENCOUNTER — Other Ambulatory Visit: Payer: Self-pay | Admitting: Primary Care

## 2022-12-03 DIAGNOSIS — R739 Hyperglycemia, unspecified: Secondary | ICD-10-CM

## 2022-12-03 LAB — COMPREHENSIVE METABOLIC PANEL
ALT: 10 U/L (ref 0–35)
AST: 16 U/L (ref 0–37)
Albumin: 4 g/dL (ref 3.5–5.2)
Alkaline Phosphatase: 66 U/L (ref 39–117)
BUN: 21 mg/dL (ref 6–23)
CO2: 28 mEq/L (ref 19–32)
Calcium: 9.2 mg/dL (ref 8.4–10.5)
Chloride: 101 mEq/L (ref 96–112)
Creatinine, Ser: 0.8 mg/dL (ref 0.40–1.20)
GFR: 74.89 mL/min (ref >60.00)
Glucose, Bld: 113 mg/dL — ABNORMAL HIGH (ref 70–99)
Potassium: 4.1 mEq/L (ref 3.5–5.1)
Sodium: 138 mEq/L (ref 135–145)
Total Bilirubin: 0.5 mg/dL (ref 0.2–1.2)
Total Protein: 7 g/dL (ref 6.0–8.3)

## 2022-12-03 LAB — LIPID PANEL
Cholesterol: 139 mg/dL (ref 0–200)
HDL: 45.2 mg/dL (ref >39.00)
LDL Cholesterol: 75 mg/dL (ref 0–99)
NonHDL: 94.27
Total CHOL/HDL Ratio: 3
Triglycerides: 94 mg/dL (ref 0.0–149.0)
VLDL: 18.8 mg/dL (ref 0.0–40.0)

## 2022-12-03 LAB — HEPATITIS C ANTIBODY: Hepatitis C Ab: NONREACTIVE

## 2022-12-03 LAB — TSH: TSH: 0.93 u[IU]/mL (ref 0.35–5.50)

## 2022-12-09 ENCOUNTER — Other Ambulatory Visit (INDEPENDENT_AMBULATORY_CARE_PROVIDER_SITE_OTHER): Payer: Medicare Other

## 2022-12-09 DIAGNOSIS — R739 Hyperglycemia, unspecified: Secondary | ICD-10-CM | POA: Diagnosis not present

## 2022-12-09 LAB — POCT GLYCOSYLATED HEMOGLOBIN (HGB A1C): Hemoglobin A1C: 4.9 % (ref 4.0–5.6)

## 2022-12-14 ENCOUNTER — Telehealth: Payer: Self-pay

## 2022-12-14 DIAGNOSIS — E890 Postprocedural hypothyroidism: Secondary | ICD-10-CM

## 2022-12-14 DIAGNOSIS — I10 Essential (primary) hypertension: Secondary | ICD-10-CM

## 2022-12-14 MED ORDER — LEVOTHYROXINE SODIUM 125 MCG PO TABS: ORAL_TABLET | ORAL | 3 refills | Status: DC

## 2022-12-14 MED ORDER — BENAZEPRIL HCL 20 MG PO TABS
20.0000 mg | ORAL_TABLET | Freq: Every day | ORAL | 3 refills | Status: DC
Start: 2022-12-14 — End: 2023-11-30

## 2022-12-14 NOTE — Telephone Encounter (Signed)
Requested Prescriptions   Signed Prescriptions Disp Refills   levothyroxine (SYNTHROID) 125 MCG tablet 90 tablet 3    Sig: TAKE ONE TABLET BY MOUTH EVERY MORNING ON AN EMPTY STOMACH WITH WATER ONLY.. NO FOOD OR OTHER MEDICATION FOR 30 MINUTES    Authorizing Provider: Vernona Rieger K   benazepril (LOTENSIN) 20 MG tablet 90 tablet 3    Sig: Take 1 tablet (20 mg total) by mouth daily. for blood pressure.    Authorizing Provider: Doreene Nest   Refill(s) sent to pharmacy.

## 2023-01-15 ENCOUNTER — Other Ambulatory Visit: Payer: Self-pay | Admitting: Neurology

## 2023-02-03 ENCOUNTER — Telehealth: Payer: Self-pay

## 2023-02-03 DIAGNOSIS — E782 Mixed hyperlipidemia: Secondary | ICD-10-CM

## 2023-02-03 MED ORDER — ATORVASTATIN CALCIUM 10 MG PO TABS: 10.0000 mg | ORAL_TABLET | Freq: Every day | ORAL | 2 refills | Status: DC

## 2023-02-03 NOTE — Addendum Note (Signed)
Addended by: Doreene Nest on: 02/03/2023 05:21 PM   Modules accepted: Orders

## 2023-03-01 ENCOUNTER — Other Ambulatory Visit: Payer: Self-pay | Admitting: Primary Care

## 2023-03-01 DIAGNOSIS — F03B4 Unspecified dementia, moderate, with anxiety: Secondary | ICD-10-CM

## 2023-03-10 ENCOUNTER — Other Ambulatory Visit: Payer: Self-pay | Admitting: Primary Care

## 2023-03-10 DIAGNOSIS — F03B4 Unspecified dementia, moderate, with anxiety: Secondary | ICD-10-CM

## 2023-03-11 ENCOUNTER — Ambulatory Visit
Admission: RE | Admit: 2023-03-11 | Discharge: 2023-03-11 | Disposition: A | Discharge status: Home / Self Care | Payer: Medicare Other | Source: Ambulatory Visit | Attending: Primary Care | Admitting: Primary Care

## 2023-03-11 ENCOUNTER — Encounter: Payer: Self-pay | Admitting: Radiology

## 2023-03-11 DIAGNOSIS — Z1231 Encounter for screening mammogram for malignant neoplasm of breast: Secondary | ICD-10-CM | POA: Diagnosis present

## 2023-03-11 DIAGNOSIS — E2839 Other primary ovarian failure: Secondary | ICD-10-CM | POA: Insufficient documentation

## 2023-03-22 NOTE — Telephone Encounter (Signed)
Patient's husband called the office regarding this request from 7/17, states he was supposed to report today and did not. Says he is needing this letter, says that the court document asks for a letter from the provider of the person they are the caregiver for. Patient's husband was asking if Jae Dire could do this for him to turn into the courthouse, he will need to respond quickly so he does not get in trouble. States he can be reached at [phone] or via mychart.

## 2023-04-14 ENCOUNTER — Encounter: Payer: Self-pay | Admitting: Neurology

## 2023-04-14 ENCOUNTER — Ambulatory Visit (INDEPENDENT_AMBULATORY_CARE_PROVIDER_SITE_OTHER): Payer: Medicare Other | Admitting: Neurology

## 2023-04-14 DIAGNOSIS — F02C18 Dementia in other diseases classified elsewhere, severe, with other behavioral disturbance: Secondary | ICD-10-CM

## 2023-04-14 DIAGNOSIS — G300 Alzheimer's disease with early onset: Secondary | ICD-10-CM | POA: Diagnosis not present

## 2023-04-14 MED ORDER — DIVALPROEX SODIUM ER 500 MG PO TB24
500.0000 mg | ORAL_TABLET | Freq: Every day | ORAL | 3 refills | Status: DC
Start: 2023-04-14 — End: 2024-01-25

## 2023-04-14 MED ORDER — DIVALPROEX SODIUM ER 250 MG PO TB24: 250.0000 mg | ORAL_TABLET | Freq: Every day | ORAL | 3 refills | Status: DC

## 2023-04-14 MED ORDER — MEMANTINE HCL 10 MG PO TABS: 10.0000 mg | ORAL_TABLET | Freq: Two times a day (BID) | ORAL | 3 refills | Status: DC

## 2023-04-14 NOTE — Progress Notes (Signed)
[facility]  PATIENT: Melanie Long DOB: 1951-06-20  REQUESTING CLINICIAN: Doreene Nest, NP HISTORY FROM: Husband  REASON FOR VISIT: New onset seizure    HISTORICAL  CHIEF COMPLAINT:  Chief Complaint  Patient presents with   Follow-up    Rm 13. Patient with husband. Husband reports some spasms but no seizures.    INTERVAL HISTORY 04/14/2023:  Patient presents today for follow-up, she is accompanied by her husband.  Last visit was a year ago and since then she has been stable, her dementia is severe, now husband has to feed her.  They are still hallucinations, but she does not get upset anymore.  No falls since last visit.  She is compliant compliant with all of medications. Denies any seizure or seizure like activity    INTERVAL HISTORY 04/13/22:  Patient presents today for follow-up, she is accompanied by her husband.  History mainly obtained by husband.  Husband states that patient was diagnosed with dementia 10 years ago, she is 100% dependent on her husband.  Her family history includes maternal grandfather and maternal grandmother with dementia, no one was diagnosed at early age.  She never was diagnosed with TBI, no stroke, no seizures, and no sleep apnea.  She did have a diagnosis of depression and anxiety after her initial diagnosis of Dementia. Since starting the Depakote, it seemed like her mood has improved.  Denies any additional seizures since last visit.   TBI:   No past history of TBI Stroke:   no past history of stroke Seizures:   no past history of seizures Sleep:   no history of sleep apnea.   Mood: Yes, after her diagnosis of dementia she had depression  Functional status: Dependent in all ADLs and IADLs Patient lives with husband and she is 100% dependent on him.   Cooking: No Cleaning: No Shopping: No Bathing: No, husband Toileting: No, husband Driving: No Bills: No Medications: Husband help with all of her medication Ever  left the stove on by accident?:  Not applicable Forget how to use items around the house?:  Yes Getting lost going to familiar places?:  Yes Forgetting loved ones names?:  Yes is Word finding difficulty?  Yes Sleep: Okay   HISTORY OF PRESENT ILLNESS:  This is a 72 year old woman with past medical history of moderate to severe dementia, diagnosed 10 years ago, hypertension, hyperlipidemia, hypothyroidism, anxiety and depression who is presenting after her first lifetime seizure on June 23.  Husband reports symptoms started 2 years ago when she started having myoclonic jerks mainly in the morning.  On June 23 during her sleep patient had full body convulsion lasting about 30 seconds, husband's report after the event patient could not wake and it was very hard to control her body.  EMS was called and patient was taken to the ED.  Husband denies any tongue biting, denies any urinary incontinence and no injuries.  In the ED, she did have a head CT which showed generalized brain atrophy but no other abnormality, and patient was started on Keppra 500 mg twice daily.  Husband reports since starting Keppra patient has been very lethargic.    OTHER MEDICAL CONDITIONS: Moderate to severe Dementia, hypertension, hyperlipidemia   REVIEW OF SYSTEMS: Full 14 system review of systems performed and negative with exception of: Unable to fully obtain   ALLERGIES: Allergies  Allergen Reactions   Ciprofloxacin    Elemental Sulfur    Penicillins    Sulfa Antibiotics  Tetanus-Diphth-Acell Pertussis     HOME MEDICATIONS: Outpatient Medications Prior to Visit  Medication Sig Dispense Refill   aspirin 325 MG tablet Take 325 mg by mouth daily.     atorvastatin (LIPITOR) 10 MG tablet Take 1 tablet (10 mg total) by mouth daily. for cholesterol. 90 tablet 2   benazepril (LOTENSIN) 20 MG tablet Take 1 tablet (20 mg total) by mouth daily. for blood pressure. 90 tablet 3   cholecalciferol (VITAMIN D) 25 MCG (1000  UNIT) tablet Take 400 Units by mouth daily.     citalopram (CELEXA) 20 MG tablet TAKE 1 TABLET(20 MG) BY MOUTH DAILY FOR ANXIETY 90 tablet 2   levothyroxine (SYNTHROID) 125 MCG tablet TAKE ONE TABLET BY MOUTH EVERY MORNING ON AN EMPTY STOMACH WITH WATER ONLY.. NO FOOD OR OTHER MEDICATION FOR 30 MINUTES 90 tablet 3   Omega-3 Fatty Acids (FISH OIL) 1000 MG CAPS Take 1,000 mg by mouth daily.     traZODone (DESYREL) 50 MG tablet TAKE 1 TABLET(50 MG) BY MOUTH AT BEDTIME FOR SLEEP 90 tablet 2   divalproex (DEPAKOTE ER) 250 MG 24 hr tablet TAKE 1 TABLET BY MOUTH EVERY DAY(TAKE WITH 500 MG FOR TOTAL DAILY DOSE OF 750) 30 tablet 11   divalproex (DEPAKOTE ER) 500 MG 24 hr tablet Take 1 tablet (500 mg total) by mouth daily. 90 tablet 3   Donepezil HCl (ADLARITY) 10 MG/DAY PTWK Place 10 mg onto the skin once a week for 28 days. (Patient not taking: Reported on 12/02/2022) 4 patch 12   memantine (NAMENDA) 10 MG tablet Take 1 tablet (10 mg total) by mouth 2 (two) times daily. 180 tablet 3   No facility-administered medications prior to visit.    PAST MEDICAL HISTORY: Past Medical History:  Diagnosis Date   Anxiety    Dementia (HCC)    Depression    Diverticulitis    Essential hypertension    Hallucinations    High cholesterol    Hypothyroidism    Osteoarthritis     PAST SURGICAL HISTORY: Past Surgical History:  Procedure Laterality Date   BREAST BIOPSY Right 12/27/2019   distortion, ribbon, discordant   COLON SURGERY     Secondary to acute diverticulitis    COLONOSCOPY     COLONOSCOPY WITH PROPOFOL N/A 07/16/2021   Procedure: COLONOSCOPY WITH PROPOFOL;  Surgeon: Toney Reil, MD;  Location: [facility] ENDOSCOPY;  Service: Gastroenterology;  Laterality: N/A;  PATIENT HAS DEMENTIA  HUSBAND IS POA - TO COME TO ENDO WITH PATIENT   THYROIDECTOMY      FAMILY HISTORY: Family History  Problem Relation Age of Onset   Arthritis Mother    Diabetes Mother    Breast cancer Mother        82's    Diabetes Father    Heart attack Father     SOCIAL HISTORY: Social History   Socioeconomic History   Marital status: Married    Spouse name: Not on file   Number of children: 1   Years of education: College   Highest education level: Not on file  Occupational History   Not on file  Tobacco Use   Smoking status: Never   Smokeless tobacco: Never  Substance and Sexual Activity   Alcohol use: Not Currently   Drug use: Never   Sexual activity: Not on file  Other Topics Concern   Not on file  Social History Narrative   Patient is retired.   Lives at home with her husband.   Drinks  3 cups of coffee daily.   Social Determinants of Health   Financial Resource Strain: Low Risk  (10/12/2022)   Overall Financial Resource Strain (CARDIA)    Difficulty of Paying Living Expenses: Not hard at all  Food Insecurity: No Food Insecurity (10/12/2022)   Hunger Vital Sign    Worried About Running Out of Food in the Last Year: Never true    Ran Out of Food in the Last Year: Never true  Transportation Needs: No Transportation Needs (10/12/2022)   PRAPARE - Administrator, Civil Service (Medical): No    Lack of Transportation (Non-Medical): No  Physical Activity: Inactive (10/12/2022)   Exercise Vital Sign    Days of Exercise per Week: 0 days    Minutes of Exercise per Session: 0 min  Stress: No Stress Concern Present (10/12/2022)   [employer] of Occupational Health - Occupational Stress Questionnaire    Feeling of Stress : Not at all  Social Connections: Moderately Isolated (10/12/2022)   Social Connection and Isolation Panel [NHANES]    Frequency of Communication with Friends and Family: More than three times a week    Frequency of Social Gatherings with Friends and Family: More than three times a week    Attends Religious Services: Never    Database administrator or Organizations: No    Attends Banker Meetings: Never    Marital Status: Married  Careers information officer Violence: Not At Risk (10/12/2022)   Humiliation, Afraid, Rape, and Kick questionnaire    Fear of Current or Ex-Partner: No    Emotionally Abused: No    Physically Abused: No    Sexually Abused: No    PHYSICAL EXAM  GENERAL EXAM/CONSTITUTIONAL: Vitals:  Vitals:   04/14/23 1436  BP: (!) 122/56  Pulse: 66  Weight: 202 lb 8 oz (91.9 kg)  Height: 5\' 7"  (1.702 m)    Body mass index is 31.72 kg/m. Wt Readings from Last 3 Encounters:  04/14/23 202 lb 8 oz (91.9 kg)  12/02/22 198 lb (89.8 kg)  10/12/22 203 lb (92.1 kg)   Patient is in no distress; well developed, nourished and groomed; neck is supple. Dependent on husband. She could not complete MOCA, could not hold a pen    MUSCULOSKELETAL: Gait, strength, tone, movements noted in Neurologic exam below  NEUROLOGIC: MENTAL STATUS:      No data to display         Awake, oriented to person but not place or time.  Unable to tell me her date of birth.  Unable to recall the ED visit.  Husband states that patient cannot remember what happened this morning. Pupil equal round and reactive, unable to count count digits, unable to identify object even when given to her hands.  Does not blink to tread. Face is symmetric.  Full strength in upper and lower extremity.  Need husband for ambulation as she is holding to husband.     DIAGNOSTIC DATA (LABS, IMAGING, TESTING) - I reviewed patient records, labs, notes, testing and imaging myself where available.  Lab Results  Component Value Date   WBC 5.9 02/05/2022   HGB 15.6 (H) 02/05/2022   HCT 46.8 (H) 02/05/2022   MCV 93.0 02/05/2022   PLT 178 02/05/2022      Component Value Date/Time   NA 138 12/02/2022 1429   K 4.1 12/02/2022 1429   CL 101 12/02/2022 1429   CO2 28 12/02/2022 1429   GLUCOSE 113 (H) 12/02/2022 1429  BUN 21 12/02/2022 1429   CREATININE 0.80 12/02/2022 1429   CALCIUM 9.2 12/02/2022 1429   PROT 7.0 12/02/2022 1429   ALBUMIN 4.0 12/02/2022 1429   AST  16 12/02/2022 1429   ALT 10 12/02/2022 1429   ALKPHOS 66 12/02/2022 1429   BILITOT 0.5 12/02/2022 1429   GFRNONAA >60 02/05/2022 0257   Lab Results  Component Value Date   CHOL 139 12/02/2022   HDL 45.20 12/02/2022   LDLCALC 75 12/02/2022   TRIG 94.0 12/02/2022   Lab Results  Component Value Date   HGBA1C 4.9 12/09/2022   No results found for: "VITAMINB12" Lab Results  Component Value Date   TSH 0.93 12/02/2022    Head CT 02/05/22 1. No acute finding. No focal cortical finding to correlate with the history. 2. Brain atrophy   EEG 03/24/22: Mild diffuse slowing    ASSESSMENT AND PLAN  72 y.o. year old female  with history of moderate to severe dementia, hypertension, hyperlipidemia, anxiety depression, who is presenting for follow up for her seizure and dementia.  In terms of the seizures, no seizure or myoclonic jerks since starting Depakote 750 mg at bedtime, she is tolerating this medication better, denies any increased daytime somnolence. Will continue her on the same dose.  For her dementia, patient likely have early onset severe dementia, likely Alzheimer, possible posterior cortical atrophy variant. Will continue her on Namenda 10 mg twice daily. Per husband, she is not agitated by entirely depending on him. Now husband has to feed her.  Will continue current medications and follow up in a year or sooner if worse    1. 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  Other recommendations include using caution when using heavy equipment or power tools. Avoid working on ladders or at heights. Take showers instead of baths.  Do not swim alone.  Ensure the water temperature is not too  high on the home water heater. Do not go swimming alone. Do not lock yourself in a room alone (i.e. bathroom). When caring for infants or small children, sit down when holding, feeding, or changing them to minimize risk of injury to the child in the event you have a seizure. Maintain good sleep hygiene. Avoid alcohol.  Also recommend adequate sleep, hydration, good diet and minimize stress.   During the Seizure  - First, ensure adequate ventilation and place patients on the floor on their left side  Loosen clothing around the neck and ensure the airway is patent. If the patient is clenching the teeth, do not force the mouth open with any object as this can cause severe damage - Remove all items from the surrounding that can be hazardous. The patient may be oblivious to what's happening and may not even know what he or she is doing. If the patient is confused and wandering, either gently guide him/her away and block access to outside areas - Reassure the individual and be comforting - Call 911. In most cases, the seizure ends before EMS arrives. However, there are cases when seizures may last over 3 to 5 minutes. Or the individual may have developed breathing difficulties or severe injuries. If a pregnant patient or a  person with diabetes develops a seizure, it is prudent to call an ambulance. - Finally, if the  patient does not regain full consciousness, then call EMS. Most patients will remain confused for about 45 to 90 minutes after a seizure, so you must use judgment in calling for help. - Avoid restraints but make sure the patient is in a bed with padded side rails - Place the individual in a lateral position with the neck slightly flexed; this will help the saliva drain from the mouth and prevent the tongue from falling backward - Remove all nearby furniture and other hazards from the area - Provide verbal assurance as the individual is regaining consciousness - Provide the patient with  privacy if possible - Call for help and start treatment as ordered by the caregiver   After the Seizure (Postictal Stage)  After a seizure, most patients experience confusion, fatigue, muscle pain and/or a headache. Thus, one should permit the individual to sleep. For the next few days, reassurance is essential. Being calm and helping reorient the person is also of importance.  Most seizures are painless and end spontaneously. Seizures are not harmful to others but can lead to complications such as stress on the lungs, brain and the heart. Individuals with prior lung problems may develop labored breathing and respiratory distress.     No orders of the defined types were placed in this encounter.   Meds ordered this encounter  Medications   divalproex (DEPAKOTE ER) 250 MG 24 hr tablet    Sig: Take 1 tablet (250 mg total) by mouth daily.    Dispense:  90 tablet    Refill:  3    ZERO refills remain on this prescription. Your patient is requesting advance approval of refills for this medication to PREVENT ANY MISSED DOSES   memantine (NAMENDA) 10 MG tablet    Sig: Take 1 tablet (10 mg total) by mouth 2 (two) times daily.    Dispense:  180 tablet    Refill:  3   divalproex (DEPAKOTE ER) 500 MG 24 hr tablet    Sig: Take 1 tablet (500 mg total) by mouth daily.    Dispense:  90 tablet    Refill:  3    Return in about 1 year (around 04/13/2024).    Windell Norfolk, MD 04/14/2023, 3:26 PM  [facility] [address], [address] [place], [place] [address] [phone]

## 2023-04-14 NOTE — Patient Instructions (Signed)
Continue current medications  Continue to follow up with PCP  Return in a year of sooner if worse

## 2023-05-17 ENCOUNTER — Ambulatory Visit (INDEPENDENT_AMBULATORY_CARE_PROVIDER_SITE_OTHER): Payer: Medicare Other | Admitting: Primary Care

## 2023-05-17 ENCOUNTER — Encounter: Payer: Self-pay | Admitting: Primary Care

## 2023-05-17 VITALS — BP 128/62 | HR 77 | Temp 97.9°F | Ht 67.0 in | Wt 208.0 lb

## 2023-05-17 DIAGNOSIS — R410 Disorientation, unspecified: Secondary | ICD-10-CM | POA: Diagnosis not present

## 2023-05-17 DIAGNOSIS — F03B4 Unspecified dementia, moderate, with anxiety: Secondary | ICD-10-CM | POA: Diagnosis not present

## 2023-05-17 DIAGNOSIS — H6123 Impacted cerumen, bilateral: Secondary | ICD-10-CM

## 2023-05-17 MED ORDER — HYDROXYZINE HCL 10 MG PO TABS
10.0000 mg | ORAL_TABLET | Freq: Two times a day (BID) | ORAL | 0 refills | Status: DC | PRN
Start: 2023-05-17 — End: 2023-07-04

## 2023-05-17 NOTE — Assessment & Plan Note (Addendum)
Progressing.  Checking urinalysis to rule out UTI. Discussed the option to obtain CT head given her fall as she is managed on aspirin 325 mg daily.  Her family denies changes in mentation and signs of headaches from patient.  They kindly declined CT head now but will update if anything changes.  Continue Namenda 10 mg twice daily, citalopram 20 mg daily, trazodone 50 mg at bedtime. Reviewed office notes from neurology from August 2024.  Add hydroxyzine 10 to 20 mg at bedtime and during the day as needed. Family will update.

## 2023-05-17 NOTE — Progress Notes (Signed)
Subjective:    Patient ID: Melanie Long, female    DOB: [birth year], 72 y.o.   MRN: [id number]  Fall Pertinent negatives include no headaches.  Anxiety Symptoms include confusion and nervous/anxious behavior. Patient reports no dizziness.     Melanie Long is a very pleasant 72 y.o. female with a history of dementia, hyperlipidemia, hypertension, postoperative hypothyroidism, cerumen impaction who presents today with her husband to discuss several concerns.  Her husband is providing information for HPI as she is unable.  Her daughter joins [place] as well.  Following with neurology, last office visit was in August 2024.  Her Depakote 750 mg at bedtime was continued as well as her Namenda 10 mg twice daily.  No changes were made.   Her family has noted gradual decline since her last visit in April of this year.  She is no longer able to feed herself.  Husband has to help.  Currently managed on citalopram 20 mg daily for anxiety and trazodone 50 mg at bedtime for sleep. Over the last month her anxiety has increased, especially when waking up, doesn't know where she's at, "acts manic", family can't get her to sit still. A few days ago she fell, unwitnessed, while anxious, hit the left frontal lobe of her head on the fireplace mantel. Also with bruising to her left forearm and left knee.  Her husband found her sitting by the fireplace.  She is at her baseline according to her husband.  She has not been grabbing her head or acting any differently than usual.  She is unable to verbalize when she is needing to use the bathroom, but her husband does not feel like she has developed urinary frequency.  Trazodone was effective initially, but several nights she's not sleeping through the night. He is requesting something to take "as needed" for anxiety and sleep.  Her husband believes that she may have another cerumen impaction as he has noticed that she is more hard of hearing.    Review of Systems   Genitourinary:  Negative for frequency.  Skin:  Positive for color change.  Neurological:  Negative for dizziness and headaches.  Psychiatric/Behavioral:  Positive for confusion. The patient is nervous/anxious.          Past Medical History:  Diagnosis Date   Anxiety    Dementia (HCC)    Depression    Diverticulitis    Essential hypertension    Hallucinations    High cholesterol    Hypothyroidism    Osteoarthritis     Social History   Socioeconomic History   Marital status: Married    Spouse name: Not on file   Number of children: 1   Years of education: College   Highest education level: Not on file  Occupational History   Not on file  Tobacco Use   Smoking status: Never   Smokeless tobacco: Never  Substance and Sexual Activity   Alcohol use: Not Currently   Drug use: Never   Sexual activity: Not on file  Other Topics Concern   Not on file  Social History Narrative   Patient is retired.   Lives at home with her husband.   Drinks 3 cups of coffee daily.   Social Determinants of Health   Financial Resource Strain: Low Risk  (10/12/2022)   Overall Financial Resource Strain (CARDIA)    Difficulty of Paying Living Expenses: Not hard at all  Food Insecurity: No Food Insecurity (10/12/2022)   Hunger Vital Sign  Worried About Programme researcher, broadcasting/film/video in the Last Year: Never true    Ran Out of Food in the Last Year: Never true  Transportation Needs: No Transportation Needs (10/12/2022)   PRAPARE - Administrator, Civil Service (Medical): No    Lack of Transportation (Non-Medical): No  Physical Activity: Inactive (10/12/2022)   Exercise Vital Sign    Days of Exercise per Week: 0 days    Minutes of Exercise per Session: 0 min  Stress: No Stress Concern Present (10/12/2022)   [employer] of Occupational Health - Occupational Stress Questionnaire    Feeling of Stress : Not at all  Social Connections: Moderately Isolated (10/12/2022)   Social Connection  and Isolation Panel [NHANES]    Frequency of Communication with Friends and Family: More than three times a week    Frequency of Social Gatherings with Friends and Family: More than three times a week    Attends Religious Services: Never    Database administrator or Organizations: No    Attends Banker Meetings: Never    Marital Status: Married  Catering manager Violence: Not At Risk (10/12/2022)   Humiliation, Afraid, Rape, and Kick questionnaire    Fear of Current or Ex-Partner: No    Emotionally Abused: No    Physically Abused: No    Sexually Abused: No    Past Surgical History:  Procedure Laterality Date   BREAST BIOPSY Right 12/27/2019   distortion, ribbon, discordant   COLON SURGERY     Secondary to acute diverticulitis    COLONOSCOPY     COLONOSCOPY WITH PROPOFOL N/A 07/16/2021   Procedure: COLONOSCOPY WITH PROPOFOL;  Surgeon: Toney Reil, MD;  Location: [facility] ENDOSCOPY;  Service: Gastroenterology;  Laterality: N/A;  PATIENT HAS DEMENTIA  HUSBAND IS POA - TO COME TO ENDO WITH PATIENT   THYROIDECTOMY      Family History  Problem Relation Age of Onset   Arthritis Mother    Diabetes Mother    Breast cancer Mother        49's   Diabetes Father    Heart attack Father     Allergies  Allergen Reactions   Ciprofloxacin    Elemental Sulfur    Penicillins    Sulfa Antibiotics    Tetanus-Diphth-Acell Pertussis     Current Outpatient Medications on File Prior to Visit  Medication Sig Dispense Refill   aspirin 325 MG tablet Take 325 mg by mouth daily.     atorvastatin (LIPITOR) 10 MG tablet Take 1 tablet (10 mg total) by mouth daily. for cholesterol. 90 tablet 2   benazepril (LOTENSIN) 20 MG tablet Take 1 tablet (20 mg total) by mouth daily. for blood pressure. 90 tablet 3   citalopram (CELEXA) 20 MG tablet TAKE 1 TABLET(20 MG) BY MOUTH DAILY FOR ANXIETY 90 tablet 2   divalproex (DEPAKOTE ER) 250 MG 24 hr tablet Take 1 tablet (250 mg total) by mouth  daily. 90 tablet 3   divalproex (DEPAKOTE ER) 500 MG 24 hr tablet Take 1 tablet (500 mg total) by mouth daily. 90 tablet 3   levothyroxine (SYNTHROID) 125 MCG tablet TAKE ONE TABLET BY MOUTH EVERY MORNING ON AN EMPTY STOMACH WITH WATER ONLY.. NO FOOD OR OTHER MEDICATION FOR 30 MINUTES 90 tablet 3   memantine (NAMENDA) 10 MG tablet Take 1 tablet (10 mg total) by mouth 2 (two) times daily. 180 tablet 3   Omega-3 Fatty Acids (FISH OIL) 1000 MG CAPS Take 1,000  mg by mouth daily.     traZODone (DESYREL) 50 MG tablet TAKE 1 TABLET(50 MG) BY MOUTH AT BEDTIME FOR SLEEP 90 tablet 2   cholecalciferol (VITAMIN D) 25 MCG (1000 UNIT) tablet Take 400 Units by mouth daily. (Patient not taking: Reported on 05/17/2023)     No current facility-administered medications on file prior to visit.    BP 128/62   Pulse 77   Temp 97.9 F (36.6 C) (Oral)   Ht 5\' 7"  (1.702 m)   Wt 208 lb (94.3 kg)   SpO2 99%   BMI 32.58 kg/m  Objective:   Physical Exam HENT:     Right Ear: There is impacted cerumen.     Left Ear: There is impacted cerumen.  Cardiovascular:     Rate and Rhythm: Normal rate and regular rhythm.  Pulmonary:     Effort: Pulmonary effort is normal.     Breath sounds: Normal breath sounds.  Musculoskeletal:     Cervical back: Neck supple.  Skin:    General: Skin is warm and dry.     Findings: Bruising present.     Comments: Moderate sized slightly raised hematoma to the left frontal lobe, light blue/green color.  No open wound.  Dark blue bruising to the left lateral mid forearm.  Mild light green bruising to the left anterior knee distal to patella.  Neurological:     Mental Status: She is alert.     Comments: Does not follow commands well.           Assessment & Plan:  Moderate dementia with anxiety, unspecified dementia type [facility]) Assessment & Plan: Progressing.  Checking urinalysis to rule out UTI. Discussed the option to obtain CT head given her fall as she is managed on  aspirin 325 mg daily.  Her family denies changes in mentation and signs of headaches from patient.  They kindly declined CT head now but will update if anything changes.  Continue Namenda 10 mg twice daily, citalopram 20 mg daily, trazodone 50 mg at bedtime. Reviewed office notes from neurology from August 2024.  Add hydroxyzine 10 to 20 mg at bedtime and during the day as needed. Family will update.  Orders: -     hydrOXYzine HCl; Take 1-2 tablets (10-20 mg total) by mouth 2 (two) times daily as needed for anxiety.  Dispense: 90 tablet; Refill: 0  Bilateral impacted cerumen Assessment & Plan: Bilateral cerumen impaction identified on exam. Patient's family consented to irrigation of canals bilaterally.  Bilateral canals irrigated. Patient tolerated well. TM's and canals post irrigation unremarkable.   Discussed home care instructions.     Confusion -     POCT Urinalysis Dipstick (Automated); Future -     Urine Culture; Future        Doreene Nest, NP

## 2023-05-17 NOTE — Patient Instructions (Addendum)
You may take hydroxyzine 10 mg tablets as needed for sleep/anxiety.  Take 1 to 2 tablets by mouth as needed.  Return the urine kit once able.  It was a pleasure to see you today!

## 2023-05-17 NOTE — Assessment & Plan Note (Signed)
Bilateral cerumen impaction identified on exam. Patient's family consented to irrigation of canals bilaterally.  Bilateral canals irrigated. Patient tolerated well. TM's and canals post irrigation unremarkable.   Discussed home care instructions.

## 2023-05-18 ENCOUNTER — Other Ambulatory Visit: Payer: Self-pay

## 2023-05-18 ENCOUNTER — Other Ambulatory Visit (INDEPENDENT_AMBULATORY_CARE_PROVIDER_SITE_OTHER): Payer: Medicare Other

## 2023-05-18 DIAGNOSIS — R410 Disorientation, unspecified: Secondary | ICD-10-CM

## 2023-05-18 LAB — POC URINALSYSI DIPSTICK (AUTOMATED)
Bilirubin, UA: NEGATIVE
Blood, UA: POSITIVE
Glucose, UA: NEGATIVE
Ketones, UA: NEGATIVE
Leukocytes, UA: NEGATIVE
Nitrite, UA: NEGATIVE
Protein, UA: NEGATIVE
Spec Grav, UA: 1.015 (ref 1.010–1.025)
Urobilinogen, UA: 0.2 U/dL
pH, UA: 6 (ref 5.0–8.0)

## 2023-05-19 LAB — URINE CULTURE
MICRO NUMBER:: 15542378
SPECIMEN QUALITY:: ADEQUATE

## 2023-06-30 ENCOUNTER — Other Ambulatory Visit: Payer: Self-pay

## 2023-06-30 DIAGNOSIS — F03B4 Unspecified dementia, moderate, with anxiety: Secondary | ICD-10-CM

## 2023-07-04 MED ORDER — HYDROXYZINE HCL 10 MG PO TABS: 10.0000 mg | ORAL_TABLET | Freq: Two times a day (BID) | ORAL | 2 refills | Status: DC | PRN

## 2023-07-04 NOTE — Addendum Note (Signed)
Addended by: Doreene Nest on: 07/04/2023 07:37 PM   Modules accepted: Orders

## 2023-08-12 IMAGING — MG DIGITAL DIAGNOSTIC BILAT W/ TOMO W/ CAD
6 of 10 series · 6 of 30 positions shown · non-contrast
Comparison: Previous exam(s).

CLINICAL DATA: History of right breast stereotactic core needle
biopsy for a distortion with benign discordant results.

Excision was not pursued.
EXAM:
DIGITAL DIAGNOSTIC BILATERAL MAMMOGRAM WITH TOMOSYNTHESIS AND CAD
TECHNIQUE: Bilateral digital diagnostic mammography and breast tomosynthesis
was performed. The images were evaluated with computer-aided
detection.

[R AT synth-2D]
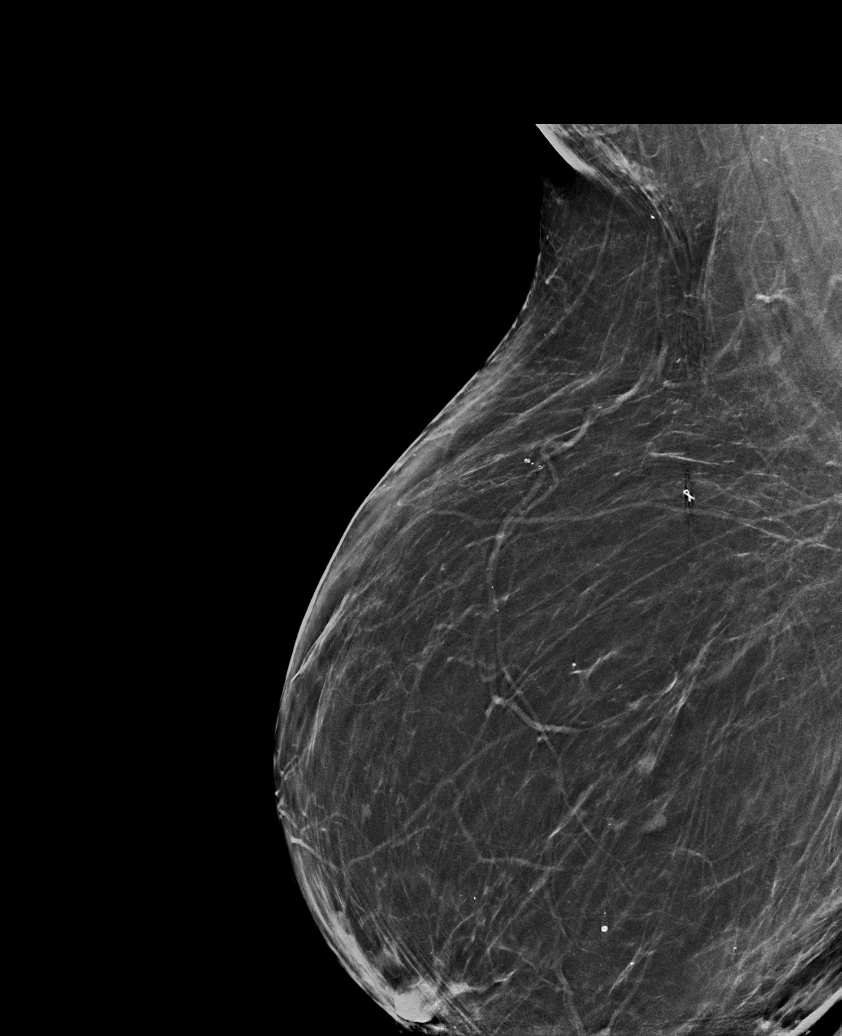

[R MLO synth-2D]
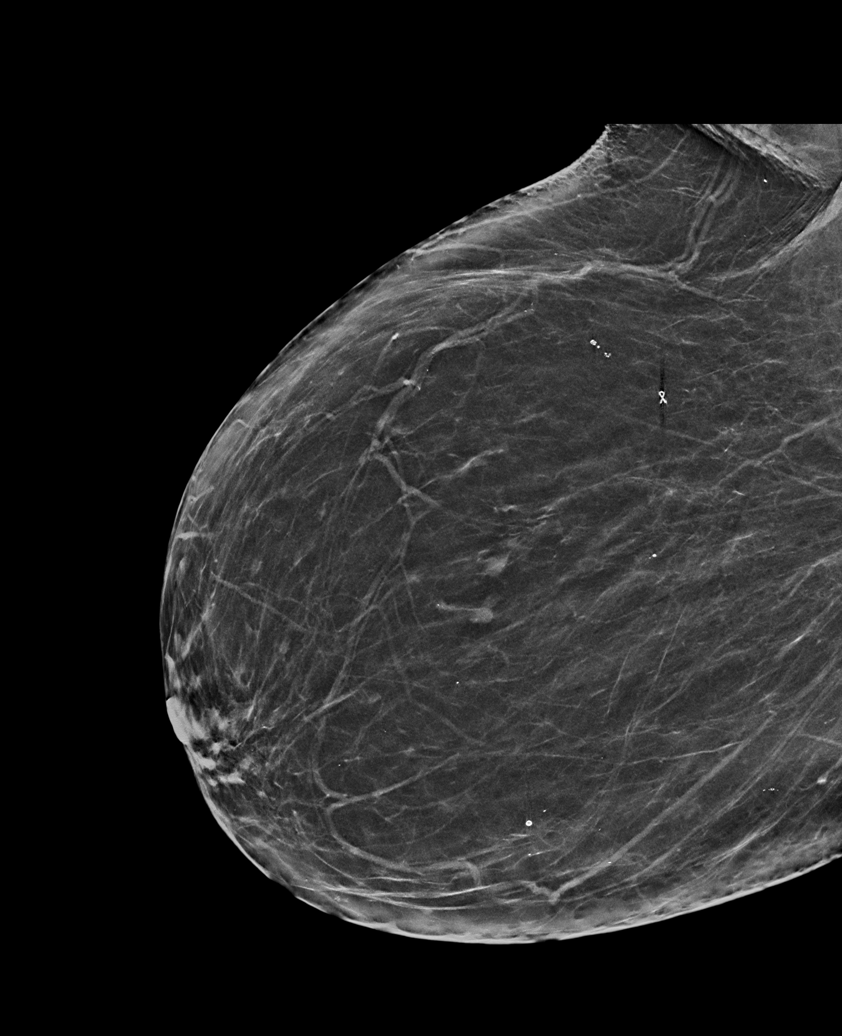

[L MLO synth-2D]
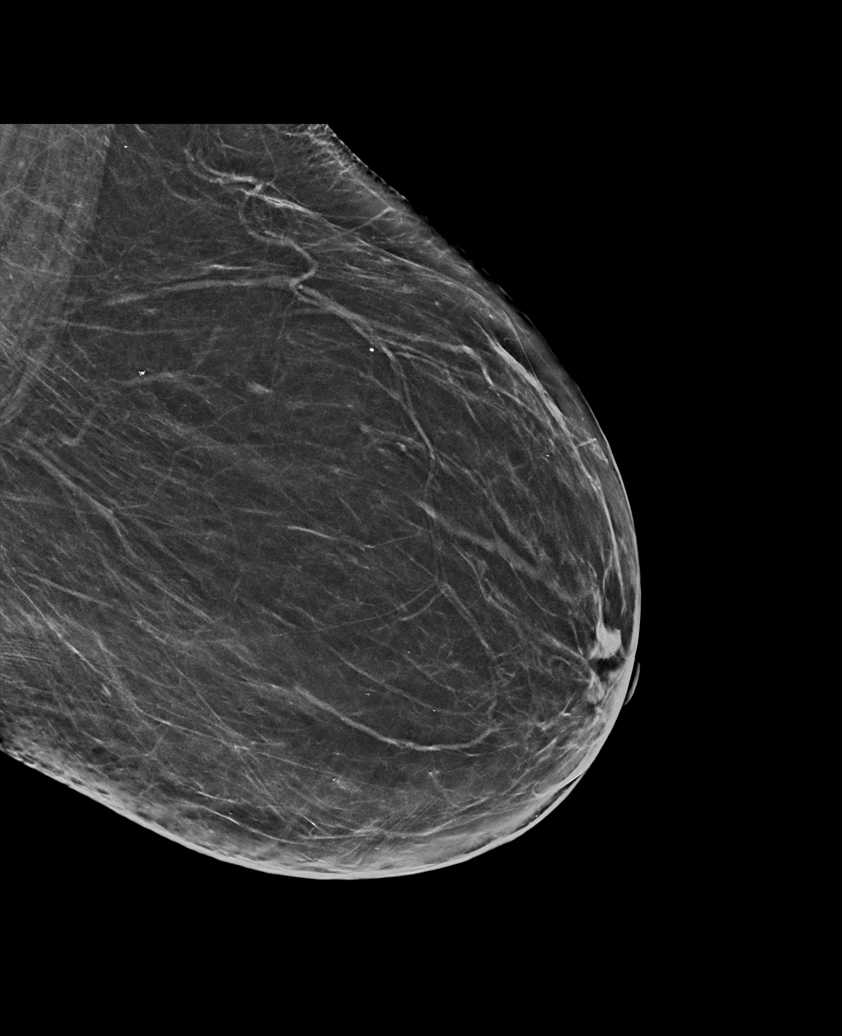

[R CC synth-2D]
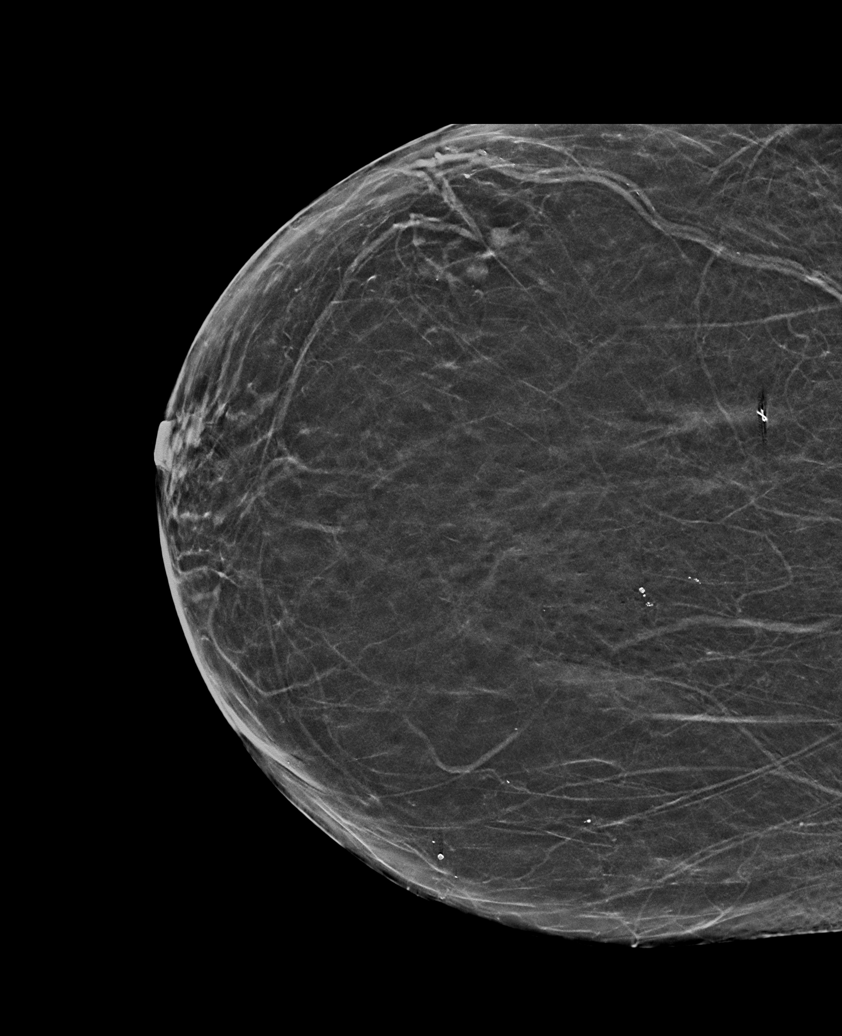

[L CC synth-2D]
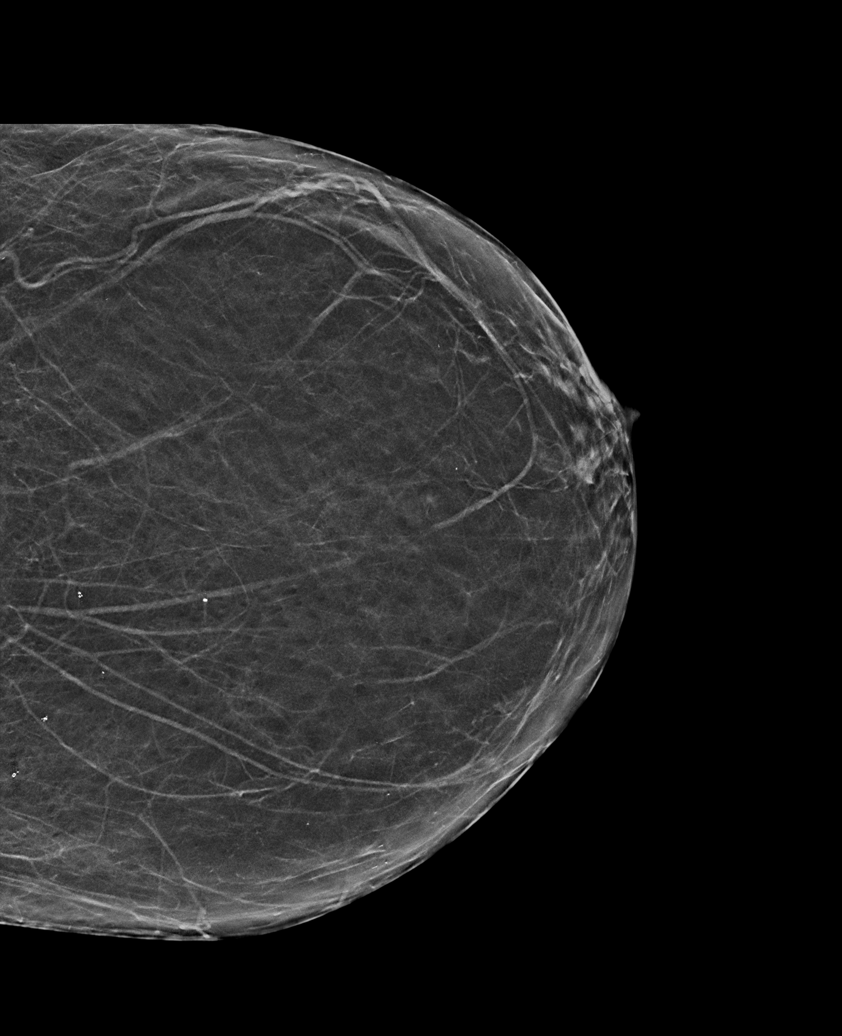

[R MLO tomo · tomo slice 33/66.0]
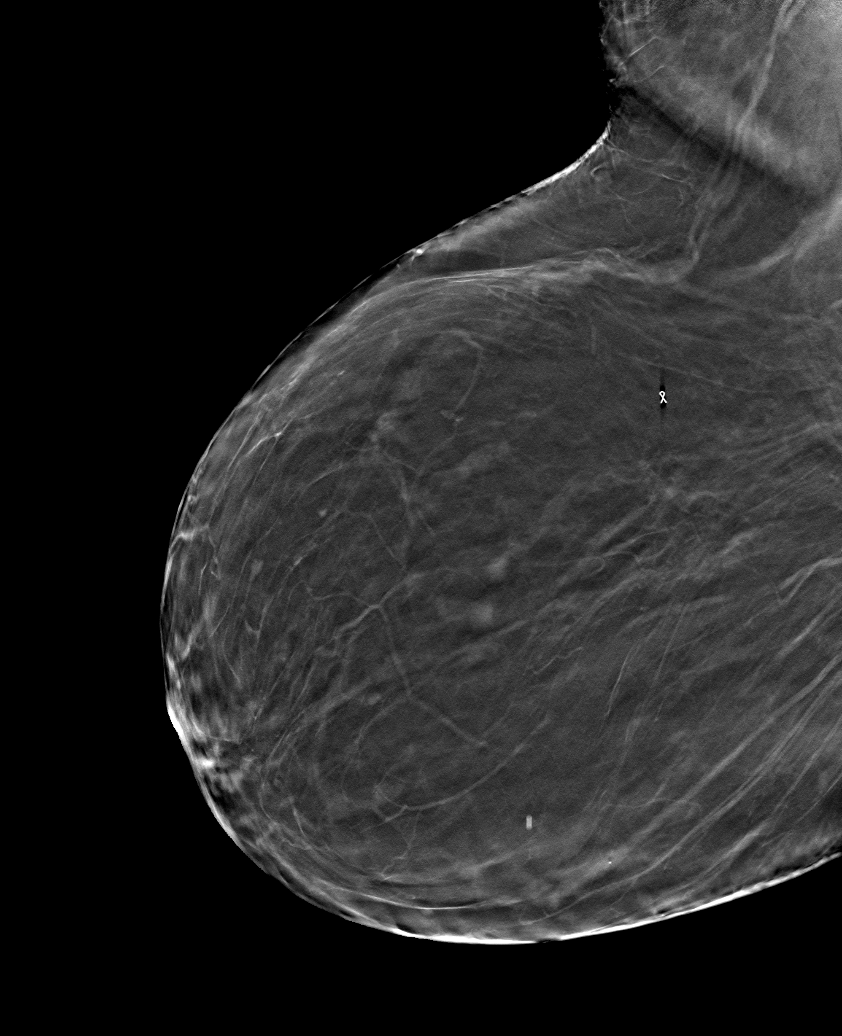

[6 of 30 positions shown; findings below may reference images not displayed]

ACR Breast Density Category b: There are scattered areas of
fibroglandular density.
FINDINGS: Mammographically, there are no new suspicious masses, areas of
architectural distortion or microcalcifications in either breast.
There is a persistent stable subtle distortion in the upper outer
right breast, posterior depth, not significantly changed from the
prior mammogram. Ribbon shaped post biopsy marker from prior
stereotactic biopsy is situated slightly superior to the area of
distortion.
IMPRESSION: Subtle stable right breast distortion, post stereotactic core needle
biopsy with benign results, deemed discordant.

RECOMMENDATION:
Recommend surgical excision, if clinically feasible.

Otherwise, screening mammography with attention to the area of
distortion.

I have discussed the findings and recommendations with the patient.
If applicable, a reminder letter will be sent to the patient
regarding the next appointment.

BI-RADS CATEGORY  2: Benign.

## 2023-09-02 ENCOUNTER — Other Ambulatory Visit: Payer: Self-pay | Admitting: Primary Care

## 2023-09-02 DIAGNOSIS — E890 Postprocedural hypothyroidism: Secondary | ICD-10-CM

## 2023-09-02 DIAGNOSIS — F03B4 Unspecified dementia, moderate, with anxiety: Secondary | ICD-10-CM

## 2023-09-02 DIAGNOSIS — E782 Mixed hyperlipidemia: Secondary | ICD-10-CM

## 2023-09-04 ENCOUNTER — Other Ambulatory Visit: Payer: Self-pay | Admitting: Primary Care

## 2023-09-04 DIAGNOSIS — I10 Essential (primary) hypertension: Secondary | ICD-10-CM

## 2023-09-04 DIAGNOSIS — F03B4 Unspecified dementia, moderate, with anxiety: Secondary | ICD-10-CM

## 2023-09-23 ENCOUNTER — Encounter (HOSPITAL_COMMUNITY): Payer: Self-pay | Admitting: Emergency Medicine

## 2023-09-23 ENCOUNTER — Inpatient Hospital Stay (HOSPITAL_COMMUNITY)
Admission: EM | Admit: 2023-09-23 | Discharge: 2023-09-27 | DRG: 445 | Disposition: A | Discharge status: Home / Self Care | Payer: Medicare Other | Attending: Internal Medicine | Admitting: Internal Medicine

## 2023-09-23 ENCOUNTER — Other Ambulatory Visit: Payer: Self-pay

## 2023-09-23 ENCOUNTER — Emergency Department (HOSPITAL_COMMUNITY): Payer: Medicare Other

## 2023-09-23 DIAGNOSIS — Z880 Allergy status to penicillin: Secondary | ICD-10-CM

## 2023-09-23 DIAGNOSIS — I10 Essential (primary) hypertension: Secondary | ICD-10-CM | POA: Diagnosis present

## 2023-09-23 DIAGNOSIS — N839 Noninflammatory disorder of ovary, fallopian tube and broad ligament, unspecified: Secondary | ICD-10-CM | POA: Diagnosis present

## 2023-09-23 DIAGNOSIS — F03C11 Unspecified dementia, severe, with agitation: Secondary | ICD-10-CM | POA: Diagnosis present

## 2023-09-23 DIAGNOSIS — Z887 Allergy status to serum and vaccine status: Secondary | ICD-10-CM

## 2023-09-23 DIAGNOSIS — K8012 Calculus of gallbladder with acute and chronic cholecystitis without obstruction: Principal | ICD-10-CM | POA: Diagnosis present

## 2023-09-23 DIAGNOSIS — M1990 Unspecified osteoarthritis, unspecified site: Secondary | ICD-10-CM | POA: Diagnosis present

## 2023-09-23 DIAGNOSIS — Z933 Colostomy status: Secondary | ICD-10-CM

## 2023-09-23 DIAGNOSIS — F32A Depression, unspecified: Secondary | ICD-10-CM | POA: Diagnosis present

## 2023-09-23 DIAGNOSIS — Z8261 Family history of arthritis: Secondary | ICD-10-CM

## 2023-09-23 DIAGNOSIS — K439 Ventral hernia without obstruction or gangrene: Secondary | ICD-10-CM

## 2023-09-23 DIAGNOSIS — K56609 Unspecified intestinal obstruction, unspecified as to partial versus complete obstruction: Secondary | ICD-10-CM | POA: Diagnosis present

## 2023-09-23 DIAGNOSIS — Z888 Allergy status to other drugs, medicaments and biological substances status: Secondary | ICD-10-CM

## 2023-09-23 DIAGNOSIS — Z993 Dependence on wheelchair: Secondary | ICD-10-CM

## 2023-09-23 DIAGNOSIS — Z881 Allergy status to other antibiotic agents status: Secondary | ICD-10-CM

## 2023-09-23 DIAGNOSIS — K810 Acute cholecystitis: Principal | ICD-10-CM | POA: Diagnosis present

## 2023-09-23 DIAGNOSIS — K8000 Calculus of gallbladder with acute cholecystitis without obstruction: Principal | ICD-10-CM | POA: Diagnosis present

## 2023-09-23 DIAGNOSIS — R109 Unspecified abdominal pain: Secondary | ICD-10-CM | POA: Diagnosis not present

## 2023-09-23 DIAGNOSIS — E782 Mixed hyperlipidemia: Secondary | ICD-10-CM | POA: Diagnosis present

## 2023-09-23 DIAGNOSIS — Z79899 Other long term (current) drug therapy: Secondary | ICD-10-CM

## 2023-09-23 DIAGNOSIS — F419 Anxiety disorder, unspecified: Secondary | ICD-10-CM | POA: Diagnosis present

## 2023-09-23 DIAGNOSIS — F0390 Unspecified dementia without behavioral disturbance: Secondary | ICD-10-CM | POA: Diagnosis present

## 2023-09-23 DIAGNOSIS — N7011 Chronic salpingitis: Secondary | ICD-10-CM | POA: Diagnosis present

## 2023-09-23 DIAGNOSIS — E890 Postprocedural hypothyroidism: Secondary | ICD-10-CM | POA: Diagnosis present

## 2023-09-23 DIAGNOSIS — Z9049 Acquired absence of other specified parts of digestive tract: Secondary | ICD-10-CM

## 2023-09-23 DIAGNOSIS — F05 Delirium due to known physiological condition: Secondary | ICD-10-CM | POA: Diagnosis not present

## 2023-09-23 DIAGNOSIS — Z8249 Family history of ischemic heart disease and other diseases of the circulatory system: Secondary | ICD-10-CM

## 2023-09-23 DIAGNOSIS — Z7982 Long term (current) use of aspirin: Secondary | ICD-10-CM

## 2023-09-23 DIAGNOSIS — Z1152 Encounter for screening for COVID-19: Secondary | ICD-10-CM

## 2023-09-23 DIAGNOSIS — Z833 Family history of diabetes mellitus: Secondary | ICD-10-CM

## 2023-09-23 DIAGNOSIS — K432 Incisional hernia without obstruction or gangrene: Secondary | ICD-10-CM | POA: Diagnosis present

## 2023-09-23 DIAGNOSIS — R569 Unspecified convulsions: Secondary | ICD-10-CM | POA: Diagnosis present

## 2023-09-23 DIAGNOSIS — Z79890 Hormone replacement therapy (postmenopausal): Secondary | ICD-10-CM

## 2023-09-23 LAB — COMPREHENSIVE METABOLIC PANEL
ALT: 14 U/L (ref 0–44)
AST: 18 U/L (ref 15–41)
Albumin: 3.4 g/dL — ABNORMAL LOW (ref 3.5–5.0)
Alkaline Phosphatase: 53 U/L (ref 38–126)
Anion gap: 14 (ref 5–15)
BUN: 25 mg/dL — ABNORMAL HIGH (ref 8–23)
CO2: 21 mmol/L — ABNORMAL LOW (ref 22–32)
Calcium: 8.9 mg/dL (ref 8.9–10.3)
Chloride: 103 mmol/L (ref 98–111)
Creatinine, Ser: 0.84 mg/dL (ref 0.44–1.00)
GFR, Estimated: >60 mL/min (ref >60)
Glucose, Bld: 132 mg/dL — ABNORMAL HIGH (ref 70–99)
Potassium: 4 mmol/L (ref 3.5–5.1)
Sodium: 138 mmol/L (ref 135–145)
Total Bilirubin: 0.6 mg/dL (ref 0.0–1.2)
Total Protein: 6.7 g/dL (ref 6.5–8.1)

## 2023-09-23 LAB — CBC WITH DIFFERENTIAL/PLATELET
Abs Immature Granulocytes: 0.14 10*3/uL — ABNORMAL HIGH (ref 0.00–0.07)
Basophils Absolute: 0 10*3/uL (ref 0.0–0.1)
Basophils Relative: 0 %
Eosinophils Absolute: 0 10*3/uL (ref 0.0–0.5)
Eosinophils Relative: 0 %
HCT: 43.2 % (ref 36.0–46.0)
Hemoglobin: 14.5 g/dL (ref 12.0–15.0)
Immature Granulocytes: 2 %
Lymphocytes Relative: 22 %
Lymphs Abs: 2 10*3/uL (ref 0.7–4.0)
MCH: 30.9 pg (ref 26.0–34.0)
MCHC: 33.6 g/dL (ref 30.0–36.0)
MCV: 92.1 fL (ref 80.0–100.0)
Monocytes Absolute: 0.9 10*3/uL (ref 0.1–1.0)
Monocytes Relative: 10 %
Neutro Abs: 5.8 10*3/uL (ref 1.7–7.7)
Neutrophils Relative %: 66 %
Platelets: 145 10*3/uL — ABNORMAL LOW (ref 150–400)
RBC: 4.69 MIL/uL (ref 3.87–5.11)
RDW: 12.9 % (ref 11.5–15.5)
WBC: 8.8 10*3/uL (ref 4.0–10.5)
nRBC: 0 % (ref 0.0–0.2)

## 2023-09-23 LAB — CK: Total CK: 47 U/L (ref 38–234)

## 2023-09-23 LAB — LIPASE, BLOOD: Lipase: 29 U/L (ref 11–51)

## 2023-09-23 NOTE — ED Provider Notes (Signed)
 [facility] Provider Note   CSN: [id number] Arrival date & time: 09/23/23  2210     History  Chief Complaint  Patient presents with   Generalized Body Aches   Abdominal Pain    Melanie Long is a 73 y.o. female.  The history is provided by the patient and medical records.  Abdominal Pain  Level 5 caveat: Dementia  73 year old female with history of hypertension, hypothyroidism, depressions, hallucinations, presenting to the ED for apparent abdominal pain and bodyaches.  Patient is not able to give any reliable history, continues yelling out for her mother.    Per EMS, patient has been complaining of pain today.  EMS reported anywhere they touch she began yelling out in pain.  Family member was recently sick with viral illness.  She was given Zofran  and route.  Home Medications Prior to Admission medications   Medication Sig Start Date End Date Taking? Authorizing Provider  aspirin 325 MG tablet Take 325 mg by mouth daily.    [provider]  atorvastatin  (LIPITOR) 10 MG tablet Take 1 tablet (10 mg total) by mouth daily. for cholesterol. 02/03/23   Clark, Katherine K, NP  benazepril  (LOTENSIN ) 20 MG tablet Take 1 tablet (20 mg total) by mouth daily. for blood pressure. 12/14/22   Clark, Katherine K, NP  cholecalciferol (VITAMIN D) 25 MCG (1000 UNIT) tablet Take 400 Units by mouth daily. Patient not taking: Reported on 05/17/2023    [provider]  citalopram  (CELEXA ) 20 MG tablet TAKE 1 TABLET(20 MG) BY MOUTH DAILY FOR ANXIETY 03/02/23   Clark, Katherine K, NP  divalproex  (DEPAKOTE  ER) 250 MG 24 hr tablet Take 1 tablet (250 mg total) by mouth daily. 04/14/23 04/08/24  Gregg Lek, MD  divalproex  (DEPAKOTE  ER) 500 MG 24 hr tablet Take 1 tablet (500 mg total) by mouth daily. 04/14/23 04/08/24  Gregg Lek, MD  hydrOXYzine  (ATARAX ) 10 MG tablet Take 1-2 tablets (10-20 mg total) by mouth 2 (two) times daily as needed for  anxiety. 07/04/23   Clark, Katherine K, NP  levothyroxine  (SYNTHROID ) 125 MCG tablet TAKE ONE TABLET BY MOUTH EVERY MORNING ON AN EMPTY STOMACH WITH WATER ONLY.. NO FOOD OR OTHER MEDICATION FOR 30 MINUTES 12/14/22   Clark, Katherine K, NP  memantine  (NAMENDA ) 10 MG tablet Take 1 tablet (10 mg total) by mouth 2 (two) times daily. 04/14/23 04/08/24  Gregg Lek, MD  Omega-3 Fatty Acids (FISH OIL) 1000 MG CAPS Take 1,000 mg by mouth daily.    [provider]  traZODone  (DESYREL ) 50 MG tablet TAKE 1 TABLET(50 MG) BY MOUTH AT BEDTIME FOR SLEEP 03/10/23   Gretta Comer POUR, NP      Allergies    Ciprofloxacin, Elemental sulfur, Penicillins, Sulfa antibiotics, and Tetanus-diphth-acell pertussis    Review of Systems   Review of Systems  Unable to perform ROS: Dementia    Physical Exam Updated Vital Signs BP (!) 162/83 (BP Location: Right Arm)   Pulse 63   Temp 98.6 F (37 C) (Oral)   Ht 5' 7 (1.702 m)   Wt 106.1 kg   SpO2 100%   BMI 36.65 kg/m   Physical Exam Vitals and nursing note reviewed.  Constitutional:      Appearance: She is well-developed.     Comments: Difficulty to examine, begins flailing and swatting about when touched  HENT:     Head: Normocephalic and atraumatic.  Eyes:     Conjunctiva/sclera: Conjunctivae normal.  Pupils: Pupils are equal, round, and reactive to light.  Cardiovascular:     Rate and Rhythm: Normal rate and regular rhythm.     Heart sounds: Normal heart sounds.  Pulmonary:     Effort: Pulmonary effort is normal.     Breath sounds: Normal breath sounds.  Abdominal:     General: Bowel sounds are normal.     Palpations: Abdomen is soft.  Musculoskeletal:        General: Normal range of motion.     Cervical back: Normal range of motion.  Skin:    General: Skin is warm and dry.  Neurological:     Mental Status: She is alert and oriented to person, place, and time.  Psychiatric:     Comments: Calling out for mother, ?hallucinations      ED Results / Procedures / Treatments   Labs (all labs ordered are listed, but only abnormal results are displayed) Labs Reviewed  CBC WITH DIFFERENTIAL/PLATELET - Abnormal; Notable for the following components:      Result Value   Platelets 145 (*)    Abs Immature Granulocytes 0.14 (*)    All other components within normal limits  COMPREHENSIVE METABOLIC PANEL - Abnormal; Notable for the following components:   CO2 21 (*)    Glucose, Bld 132 (*)    BUN 25 (*)    Albumin 3.4 (*)    All other components within normal limits  URINALYSIS, ROUTINE W REFLEX MICROSCOPIC - Abnormal; Notable for the following components:   Hgb urine dipstick MODERATE (*)    Leukocytes,Ua TRACE (*)    All other components within normal limits  RESP PANEL BY RT-PCR (RSV, FLU A&B, COVID)  RVPGX2  RESPIRATORY PANEL BY PCR  URINE CULTURE  LIPASE, BLOOD  CK    EKG None  Radiology CT ABDOMEN PELVIS W CONTRAST Result Date: 09/24/2023 CLINICAL DATA:  Abdominal pain. EXAM: CT ABDOMEN AND PELVIS WITH CONTRAST TECHNIQUE: Multidetector CT imaging of the abdomen and pelvis was performed using the standard protocol following bolus administration of intravenous contrast. RADIATION DOSE REDUCTION: This exam was performed according to the departmental dose-optimization program which includes automated exposure control, adjustment of the mA and/or kV according to patient size and/or use of iterative reconstruction technique. CONTRAST:  75mL OMNIPAQUE  IOHEXOL  350 MG/ML SOLN COMPARISON:  None Available. FINDINGS: Lower chest: Lung bases show posterior atelectasis without infiltrates. Mild elevation of the right diaphragm. The cardiac size is normal. There are scattered single-vessel calcifications in the LAD coronary artery. Minimal pericardial effusion. Hepatobiliary: The liver enhances homogeneously. No mass is seen. No biliary dilatation. The gallbladder is dilated measuring 13 cm in length with wall thickening,  pericholecystic fluid and stones. Findings most likely due to acute cholecystitis. There is a 2.8 cm stone in the gallbladder neck which may well be impacted. There are a few more stones more distally. The largest fundal stone is 4 cm. Pancreas: Partially atrophic.  Focal abnormality. Spleen: No mass.  No splenomegaly. Adrenals/Urinary Tract: Adrenal glands are unremarkable. Kidneys are normal, without renal calculi, focal lesion, or hydronephrosis. Bladder is unremarkable. Stomach/Bowel: Small hiatal hernia. Unremarkable stomach. In the abdomen and pelvis proper, the small bowel is normal in caliber. There is a wide mouth supraumbilical midline hernia through an 8 cm wide wall defect containing a portion of the mid transverse colon. Beginning just below this hernia, there is another wide mouth hernia through a 7 cm wide defect, containing a portion of the proximal to mid transverse colon  and a loop of the small bowel which is normal caliber. This hernia sac ends just above the umbilicus. At and below the umbilicus, there is wide rectus diastasis as much as 11 cm in diameter and a large complex ventral hernia with the sac extending down as far as the level of the pubic bone, containing the cecum and proximal ascending colon and multiple small bowel segments some of which are mildly dilated up to 2.6 cm. This could indicate ileus or low-grade small bowel obstruction within the hernia although a discrete transition was not convincingly seen. There is a fourth hernia of the anterolateral left lower abdominal wall through a 4 cm wide defect containing a portion of the distal descending/proximal sigmoid colon, and appears to be nonobstructing. The appendix is surgically absent. No thickening is seen in the bowel wall. No pneumatosis. Vascular/Lymphatic: Aortic atherosclerosis. No enlarged abdominal or pelvic lymph nodes. Reproductive: The uterus is intact. The right ovary is unremarkable. There is a left ovarian versus  paraovarian mass measuring 5.0 x 3.2 x 4 cm, homogeneously low in attenuation measuring 27 Hounsfield units. Pelvic ultrasound is recommended. Multiple pelvic phleboliths. Other: No free fluid, free hemorrhage or free air. No edema is seen in the above hernia sacs. Musculoskeletal: There is extensive bridging enthesopathy of the thoracic spine, degenerative changes and spondylosis lumbar spine with advanced facet hypertrophy in the lumbar region from L3 down. Unilateral left pars defect appears chronic at L5 with slight grade 1 L5-S1 spondylolisthesis. No acute or other significant osseous findings. The SI joints are patent. IMPRESSION: 1. Findings consistent with acute cholecystitis, with a 2.8 cm stone in the gallbladder neck which may be impacted. Additional stones up to 4 cm more distally. 2. Aortic and single-vessel coronary artery atherosclerosis. 3. Small hiatal hernia. 4. Four abdominal wall hernias, 2 above the umbilicus, one below the umbilicus, and one in the anterolateral left lower abdominal wall. 5. The 2 most cephalad above hernias contain portions of the transverse colon and small bowel, and the largest ventral hernia contains the cecum and proximal ascending colon and multiple small bowel segments some of which are mildly dilated up to 2.6 cm. This could indicate ileus or low-grade small bowel obstruction within the hernia although a discrete transition was not convincingly seen. 6. 5.0 x 3.2 x 4 cm left ovarian versus paraovarian mass. Pelvic ultrasound is recommended. 7. Chronic left L5 pars defect with slight grade 1 L5-S1 spondylolisthesis. 8. These results will be called to the ordering clinician or representative by the Radiologist Assistant, and communication documented in the PACS or [employer]. Aortic Atherosclerosis (ICD10-I70.0). Electronically Signed   By: Francis Quam M.D.   On: 09/24/2023 04:20   DG Chest Port 1 View Result Date: 09/23/2023 CLINICAL DATA:  Altered mental  status and body aches. EXAM: PORTABLE CHEST 1 VIEW COMPARISON:  None Available. FINDINGS: The heart size and mediastinal contours are within normal limits. There is scattered aortic calcific plaque. Both lungs are clear with slightly elevated right diaphragm. The visualized skeletal structures are intact, with thoracic spondylosis. IMPRESSION: No evidence of acute chest disease. Aortic atherosclerosis. Electronically Signed   By: Francis Quam M.D.   On: 09/23/2023 23:05    Procedures Procedures    Medications Ordered in ED Medications  acetaminophen  (TYLENOL ) tablet 650 mg (has no administration in time range)    Or  acetaminophen  (TYLENOL ) suppository 650 mg (has no administration in time range)  ondansetron  (ZOFRAN ) injection 4 mg (has no administration in time  range)  fentaNYL  (SUBLIMAZE ) injection 50 mcg (50 mcg Intravenous Given 09/24/23 0539)  fentaNYL  (SUBLIMAZE ) injection 50 mcg (50 mcg Intravenous Given 09/24/23 0059)  ondansetron  (ZOFRAN ) injection 4 mg (4 mg Intravenous Given 09/24/23 0200)  LORazepam  (ATIVAN ) injection 1 mg (1 mg Intravenous Given 09/24/23 0200)  iohexol  (OMNIPAQUE ) 350 MG/ML injection 75 mL (75 mLs Intravenous Contrast Given 09/24/23 0226)  cefTRIAXone  (ROCEPHIN ) 2 g in sodium chloride  0.9 % 100 mL IVPB (0 g Intravenous Stopped 09/24/23 0519)    ED Course/ Medical Decision Making/ A&P                                 Medical Decision Making Amount and/or Complexity of Data Reviewed Labs: ordered. Radiology: ordered and independent interpretation performed. ECG/medicine tests: ordered and independent interpretation performed.  Risk Prescription drug management. Decision regarding hospitalization.    73 year old for female with reported abdominal pain.  She is not able to give any reliable history and family not at bedside upon arrival.  EMS reported some sick contacts.  Patient is somewhat difficult to examine, flailing and swatting when attempted to be touched.   Will check labs, RVP, UA, CXR.    Labs thus far reassuring--no leukocytosis or electrolyte derangement.  Normal lipase.  RVP is negative.  Viral panel also negative.  UA with small amount of blood, may be traumatic from In-N-Out cath.  Culture is pending.  Chest x-ray is clear.  1:22 AM Spoke with family who is now at bedside-- sounds like acute onset of abdominal pain today.  Has had some retching at home but no vomiting.  No fever or diarrhea.  Granddaughter sick recently with 24 hour bug but was not in contact with patient directly.  Hx of diverticulitis requiring colectomy and colostomy s/p takedown.  Patient now more cooperative with physical exam but still not overtly tender with palpation.  Does have some large hernias which are chronic per family.  Will get CT for further evaluation.  CT with findings of acute cholecystitis.  She does have stone lodged in the neck, other layered stones.  Also has large hernias of the abdominal wall, these are known.  Largest does contain some bowel, questionably possible ileus versus SBO but no clear transition point.  Patient is no longer vomiting.  Results discussed with patient and family.  We discussed with general surgery but I anticipate she will need a medical admission given her age and comorbidities.  4:47 AM Spoke with on call general surgery, Dr. Polly-- medical admission.  Will see in consult.  Spoke with hospitalist, Dr. Marcene-- will admit for ongoing care.  Final Clinical Impression(s) / ED Diagnoses Final diagnoses:  Acute cholecystitis  Abdominal wall hernia    Rx / DC Orders ED Discharge Orders     None         Jarold Olam HERO, PA-C 09/24/23 [id number]    Tonia Chew, MD 09/25/23 2318

## 2023-09-23 NOTE — ED Triage Notes (Signed)
 Pt family told EMS pt grabbed her abdomen and was complaining of pain today.  EMS states everywhere they touch pt complains of pain and body aches.  Pt has dementia and it is difficult to communicate with her. Family stated a family member did have a bug recently. EMS gave zofran  4mg  IV 20G right FA VS 136/96 CBG 130 HR 60 O2 94% room air  RR 22

## 2023-09-24 ENCOUNTER — Inpatient Hospital Stay (HOSPITAL_COMMUNITY): Payer: Medicare Other

## 2023-09-24 ENCOUNTER — Emergency Department (HOSPITAL_COMMUNITY): Payer: Medicare Other

## 2023-09-24 DIAGNOSIS — N839 Noninflammatory disorder of ovary, fallopian tube and broad ligament, unspecified: Secondary | ICD-10-CM | POA: Diagnosis present

## 2023-09-24 DIAGNOSIS — K810 Acute cholecystitis: Principal | ICD-10-CM | POA: Diagnosis present

## 2023-09-24 DIAGNOSIS — I10 Essential (primary) hypertension: Secondary | ICD-10-CM

## 2023-09-24 DIAGNOSIS — Z933 Colostomy status: Secondary | ICD-10-CM | POA: Diagnosis not present

## 2023-09-24 DIAGNOSIS — M1990 Unspecified osteoarthritis, unspecified site: Secondary | ICD-10-CM | POA: Diagnosis present

## 2023-09-24 DIAGNOSIS — F05 Delirium due to known physiological condition: Secondary | ICD-10-CM | POA: Diagnosis not present

## 2023-09-24 DIAGNOSIS — F419 Anxiety disorder, unspecified: Secondary | ICD-10-CM | POA: Diagnosis present

## 2023-09-24 DIAGNOSIS — Z79899 Other long term (current) drug therapy: Secondary | ICD-10-CM | POA: Diagnosis not present

## 2023-09-24 DIAGNOSIS — K56609 Unspecified intestinal obstruction, unspecified as to partial versus complete obstruction: Secondary | ICD-10-CM | POA: Diagnosis present

## 2023-09-24 DIAGNOSIS — E890 Postprocedural hypothyroidism: Secondary | ICD-10-CM | POA: Diagnosis present

## 2023-09-24 DIAGNOSIS — Z7982 Long term (current) use of aspirin: Secondary | ICD-10-CM | POA: Diagnosis not present

## 2023-09-24 DIAGNOSIS — E782 Mixed hyperlipidemia: Secondary | ICD-10-CM | POA: Diagnosis present

## 2023-09-24 DIAGNOSIS — Z79890 Hormone replacement therapy (postmenopausal): Secondary | ICD-10-CM | POA: Diagnosis not present

## 2023-09-24 DIAGNOSIS — Z8261 Family history of arthritis: Secondary | ICD-10-CM | POA: Diagnosis not present

## 2023-09-24 DIAGNOSIS — Z1152 Encounter for screening for COVID-19: Secondary | ICD-10-CM | POA: Diagnosis not present

## 2023-09-24 DIAGNOSIS — R569 Unspecified convulsions: Secondary | ICD-10-CM | POA: Diagnosis present

## 2023-09-24 DIAGNOSIS — K432 Incisional hernia without obstruction or gangrene: Secondary | ICD-10-CM | POA: Diagnosis present

## 2023-09-24 DIAGNOSIS — N7011 Chronic salpingitis: Secondary | ICD-10-CM | POA: Diagnosis present

## 2023-09-24 DIAGNOSIS — F32A Depression, unspecified: Secondary | ICD-10-CM | POA: Diagnosis present

## 2023-09-24 DIAGNOSIS — Z833 Family history of diabetes mellitus: Secondary | ICD-10-CM | POA: Diagnosis not present

## 2023-09-24 DIAGNOSIS — F03B4 Unspecified dementia, moderate, with anxiety: Secondary | ICD-10-CM

## 2023-09-24 DIAGNOSIS — Z9049 Acquired absence of other specified parts of digestive tract: Secondary | ICD-10-CM | POA: Diagnosis not present

## 2023-09-24 DIAGNOSIS — Z8249 Family history of ischemic heart disease and other diseases of the circulatory system: Secondary | ICD-10-CM | POA: Diagnosis not present

## 2023-09-24 DIAGNOSIS — F03C11 Unspecified dementia, severe, with agitation: Secondary | ICD-10-CM | POA: Diagnosis present

## 2023-09-24 DIAGNOSIS — R109 Unspecified abdominal pain: Secondary | ICD-10-CM | POA: Diagnosis present

## 2023-09-24 DIAGNOSIS — Z993 Dependence on wheelchair: Secondary | ICD-10-CM | POA: Diagnosis not present

## 2023-09-24 DIAGNOSIS — K8000 Calculus of gallbladder with acute cholecystitis without obstruction: Secondary | ICD-10-CM | POA: Diagnosis present

## 2023-09-24 HISTORY — DX: Acute cholecystitis: K81.0

## 2023-09-24 LAB — RESPIRATORY PANEL BY PCR

## 2023-09-24 LAB — URINALYSIS, ROUTINE W REFLEX MICROSCOPIC
Bacteria, UA: NONE SEEN
Bilirubin Urine: NEGATIVE
Glucose, UA: NEGATIVE mg/dL
Ketones, ur: NEGATIVE mg/dL
Nitrite: NEGATIVE
Protein, ur: NEGATIVE mg/dL
Specific Gravity, Urine: 1.015 (ref 1.005–1.030)
pH: 7 (ref 5.0–8.0)

## 2023-09-24 LAB — RESP PANEL BY RT-PCR (RSV, FLU A&B, COVID)  RVPGX2
Influenza A by PCR: NEGATIVE
Influenza B by PCR: NEGATIVE
Resp Syncytial Virus by PCR: NEGATIVE
SARS Coronavirus 2 by RT PCR: NEGATIVE

## 2023-09-24 MED ORDER — IOHEXOL 350 MG/ML SOLN
75.0000 mL | Freq: Once | INTRAVENOUS | Status: AC | PRN
  Administered 2023-09-24: 75 mL via INTRAVENOUS

## 2023-09-24 MED ORDER — ACETAMINOPHEN 650 MG RE SUPP: 650.0000 mg | Freq: Four times a day (QID) | RECTAL | Status: DC | PRN

## 2023-09-24 MED ORDER — LEVOTHYROXINE SODIUM 25 MCG PO TABS
125.0000 ug | ORAL_TABLET | Freq: Every day | ORAL | Status: DC
  Administered 2023-09-24 – 2023-09-27 (×4): 125 ug via ORAL
  Filled 2023-09-24 (×4): qty 1

## 2023-09-24 MED ORDER — ONDANSETRON HCL 4 MG/2ML IJ SOLN
4.0000 mg | Freq: Once | INTRAMUSCULAR | Status: AC
  Administered 2023-09-24: 4 mg via INTRAVENOUS
  Filled 2023-09-24: qty 2

## 2023-09-24 MED ORDER — DIVALPROEX SODIUM ER 500 MG PO TB24
500.0000 mg | ORAL_TABLET | Freq: Every day | ORAL | Status: DC
  Administered 2023-09-24 – 2023-09-27 (×4): 500 mg via ORAL
  Filled 2023-09-24 (×4): qty 1

## 2023-09-24 MED ORDER — HEPARIN SODIUM (PORCINE) 5000 UNIT/ML IJ SOLN
5000.0000 [IU] | Freq: Three times a day (TID) | INTRAMUSCULAR | Status: DC
  Administered 2023-09-25 – 2023-09-27 (×5): 5000 [IU] via SUBCUTANEOUS
  Filled 2023-09-24 (×5): qty 1

## 2023-09-24 MED ORDER — SODIUM CHLORIDE 0.9 % IV SOLN
2.0000 g | Freq: Once | INTRAVENOUS | Status: AC
  Administered 2023-09-24: 2 g via INTRAVENOUS
  Filled 2023-09-24: qty 20

## 2023-09-24 MED ORDER — ONDANSETRON HCL 4 MG/2ML IJ SOLN: 4.0000 mg | Freq: Four times a day (QID) | INTRAMUSCULAR | Status: DC | PRN

## 2023-09-24 MED ORDER — POLYETHYLENE GLYCOL 3350 17 G PO PACK: 17.0000 g | PACK | Freq: Every day | ORAL | Status: DC | PRN

## 2023-09-24 MED ORDER — LACTATED RINGERS IV SOLN
INTRAVENOUS | Status: AC
Start: 2023-09-24 — End: 2023-09-25

## 2023-09-24 MED ORDER — HYDRALAZINE HCL 20 MG/ML IJ SOLN: 5.0000 mg | INTRAMUSCULAR | Status: DC | PRN

## 2023-09-24 MED ORDER — BENAZEPRIL HCL 20 MG PO TABS
20.0000 mg | ORAL_TABLET | Freq: Every day | ORAL | Status: DC
  Administered 2023-09-24 – 2023-09-27 (×4): 20 mg via ORAL
  Filled 2023-09-24 (×4): qty 1

## 2023-09-24 MED ORDER — HEPARIN SODIUM (PORCINE) 5000 UNIT/ML IJ SOLN: 5000.0000 [IU] | Freq: Three times a day (TID) | INTRAMUSCULAR | Status: DC

## 2023-09-24 MED ORDER — ALBUTEROL SULFATE (2.5 MG/3ML) 0.083% IN NEBU: 2.5000 mg | INHALATION_SOLUTION | RESPIRATORY_TRACT | Status: DC | PRN

## 2023-09-24 MED ORDER — FENTANYL CITRATE PF 50 MCG/ML IJ SOSY
50.0000 ug | PREFILLED_SYRINGE | Freq: Once | INTRAMUSCULAR | Status: AC
  Administered 2023-09-24: 50 ug via INTRAVENOUS
  Filled 2023-09-24: qty 1

## 2023-09-24 MED ORDER — SODIUM CHLORIDE 0.9 % IV SOLN
2.0000 g | INTRAVENOUS | Status: DC
  Administered 2023-09-25 – 2023-09-27 (×3): 2 g via INTRAVENOUS
  Filled 2023-09-24 (×3): qty 20

## 2023-09-24 MED ORDER — BISACODYL 10 MG RE SUPP: 10.0000 mg | Freq: Every day | RECTAL | Status: DC | PRN

## 2023-09-24 MED ORDER — METRONIDAZOLE 500 MG/100ML IV SOLN
500.0000 mg | Freq: Two times a day (BID) | INTRAVENOUS | Status: DC
  Administered 2023-09-24 – 2023-09-27 (×7): 500 mg via INTRAVENOUS
  Filled 2023-09-24 (×7): qty 100

## 2023-09-24 MED ORDER — MORPHINE SULFATE (PF) 2 MG/ML IV SOLN: 1.0000 mg | INTRAVENOUS | Status: DC | PRN

## 2023-09-24 MED ORDER — LORAZEPAM 2 MG/ML IJ SOLN
1.0000 mg | Freq: Once | INTRAMUSCULAR | Status: AC
  Administered 2023-09-24: 1 mg via INTRAVENOUS
  Filled 2023-09-24: qty 1

## 2023-09-24 MED ORDER — DIVALPROEX SODIUM ER 250 MG PO TB24
250.0000 mg | ORAL_TABLET | Freq: Every day | ORAL | Status: DC
  Administered 2023-09-24 – 2023-09-27 (×4): 250 mg via ORAL
  Filled 2023-09-24 (×4): qty 1

## 2023-09-24 MED ORDER — FENTANYL CITRATE PF 50 MCG/ML IJ SOSY
50.0000 ug | PREFILLED_SYRINGE | INTRAMUSCULAR | Status: DC | PRN
  Administered 2023-09-24: 50 ug via INTRAVENOUS
  Filled 2023-09-24: qty 1

## 2023-09-24 MED ORDER — ACETAMINOPHEN 325 MG PO TABS
650.0000 mg | ORAL_TABLET | Freq: Four times a day (QID) | ORAL | Status: DC | PRN
  Administered 2023-09-25 – 2023-09-27 (×5): 650 mg via ORAL
  Filled 2023-09-24 (×6): qty 2

## 2023-09-24 NOTE — Evaluation (Signed)
 Physical Therapy Evaluation Patient Details Name: Melanie Long MRN: [id number] DOB: 1950/09/29 Today's Date: 09/24/2023  History of Present Illness  73 y.o. female presents to [facility] 09/23/23 with generalized body aches and abdominal pain. Abdomen CT showed acute cholecystitis with cholelithiasis, multiple ventral hernias, small bowel dilation, L ovarian versus periovarian mass. PMHx: WC dependent at baseline, advanced dementia, seizures, HTN, OA   Clinical Impression  Pt in bed upon arrival with husband and daughter present. Family provided history as pt is oriented to self at baseline and has difficulty answering questions. Pt's husband is the primary caregiver and has been assisting the pt with all ADLs, iADLs, and with all mobility. Pt has been able to ambulate short distances while holding onto a family member's arm. In today's session, pt required ModAx2 for bed mobility. Pt was able to stand with [facility] and ModAx2. Pt was unsteady upon standing and required physical assist to maintain balance. Pt was able to take short side steps towards [facility] with increased time and cueing. Pt presents to therapy session with decreased strength, balance, mobility, and activity tolerance. Pt would benefit from acute skilled PT to address functional impairments. Recommending post-acute HHPT to work on progressing mobility and decrease caregiver burden. Acute PT to follow.          If plan is discharge home, recommend the following: A lot of help with walking and/or transfers;A lot of help with bathing/dressing/bathroom;Assistance with cooking/housework;Assistance with feeding;Direct supervision/assist for medications management;Direct supervision/assist for financial management;Assist for transportation;Help with stairs or ramp for entrance   Can travel by private vehicle    No    Equipment Recommendations BSC/3in1;Wheelchair cushion (measurements PT);Wheelchair (measurements PT)     Functional Status Assessment  Patient has had a recent decline in their functional status and demonstrates the ability to make significant improvements in function in a reasonable and predictable amount of time.     Precautions / Restrictions Precautions Precautions: Fall Restrictions Weight Bearing Restrictions Per Provider Order: No      Mobility  Bed Mobility Overal bed mobility: Needs Assistance Bed Mobility: Supine to Sit, Sit to Supine    Supine to sit: Mod assist, +2 for physical assistance, HOB elevated Sit to supine: Mod assist, +2 for physical assistance   General bed mobility comments: ModAx2 to sit upright in bed and move LE's over. ModAx2 for return to supine for trunk descent and LE mangement. Increased time and frequent cueing    Transfers Overall transfer level: Needs assistance Equipment used: 2 person hand held assist Transfers: Sit to/from Stand Sit to Stand: Mod assist, +2 physical assistance     General transfer comment: ModAx2 for boost up and steadying. Pt unstable upon rising requiring support for balance. Able to take very small side steps towards [facility] with increased time and cueing to shimmy- shimmy    Ambulation/Gait  General Gait Details: deferred due to decreased balance    Balance Overall balance assessment: Needs assistance, Mild deficits observed, not formally tested Sitting-balance support: Bilateral upper extremity supported, Feet supported Sitting balance-Leahy Scale: Fair Sitting balance - Comments: CGA for safety Postural control: Posterior lean Standing balance support: Bilateral upper extremity supported, During functional activity, Reliant on assistive device for balance Standing balance-Leahy Scale: Poor Standing balance comment: posterior lean with initial stand, reliant on external support and B UE support         Pertinent Vitals/Pain Pain Assessment Pain Assessment: No/denies pain    Home Living Family/patient expects to be discharged to:: Private  residence  Living Arrangements: Spouse/significant other;Children Available Help at Discharge: Family;Available 24 hours/day Type of Home: House Home Access: Ramped entrance;Stairs to enter Entrance Stairs-Rails: Left (grabs onto [employer]) Entrance Stairs-Number of Steps: 1 (half step)   Home Layout: One level Home Equipment: Shower seat - built in;Grab bars - tub/shower;Grab bars - toilet;Wheelchair - manual      Prior Function Prior Level of Function : Needs assist     Physical Assist : Mobility (physical);ADLs (physical) Mobility (physical): Transfers;Bed mobility;Gait ADLs (physical): Bathing;Toileting;IADLs;Feeding;Grooming;Dressing Mobility Comments: needs physical assist with all mobility, will hold onto family's hands. No able to use AD due to impaired cognition       Extremity/Trunk Assessment   Upper Extremity Assessment Upper Extremity Assessment: Defer to OT evaluation    Lower Extremity Assessment Lower Extremity Assessment: Generalized weakness    Cervical / Trunk Assessment Cervical / Trunk Assessment: Kyphotic  Communication   Communication Communication: Difficulty communicating thoughts/reduced clarity of speech Cueing Techniques: Verbal cues;Tactile cues  Cognition Arousal: Alert Behavior During Therapy: Flat affect Overall Cognitive Status: History of cognitive impairments - at baseline   General Comments: A&Ox1, family reports pt is at cognitive baseline. Follows single step commands inconsistently and with increased time. Responds better to family members        General Comments General comments (skin integrity, edema, etc.): VSS on RA, husband and daughter supportive throughout session     PT Assessment Patient needs continued PT services  PT Problem List Decreased strength;Decreased range of motion;Decreased activity tolerance;Decreased balance;Decreased mobility;Decreased knowledge of use of DME;Decreased safety awareness;Obesity        PT Treatment Interventions DME instruction;Gait training;Functional mobility training;Therapeutic activities;Therapeutic exercise;Balance training;Neuromuscular re-education;Patient/family education;Wheelchair mobility training    PT Goals (Current goals can be found in the Care Plan section)  Acute Rehab PT Goals Patient Stated Goal: unable to state goal PT Goal Formulation: With family Time For Goal Achievement: 10/08/23 Potential to Achieve Goals: Fair    Frequency Min 1X/week        AM-PAC PT 6 Clicks Mobility  Outcome Measure Help needed turning from your back to your side while in a flat bed without using bedrails?: Total Help needed moving from lying on your back to sitting on the side of a flat bed without using bedrails?: Total Help needed moving to and from a bed to a chair (including a wheelchair)?: Total Help needed standing up from a chair using your arms (e.g., wheelchair or bedside chair)?: Total Help needed to walk in hospital room?: Total Help needed climbing 3-5 steps with a railing? : Total 6 Click Score: 6    End of Session Equipment Utilized During Treatment: Gait belt Activity Tolerance: Patient tolerated treatment well Patient left: in bed;with call bell/phone within reach;with bed alarm set;with family/visitor present Nurse Communication: Mobility status PT Visit Diagnosis: Unsteadiness on feet (R26.81);Other abnormalities of gait and mobility (R26.89);Muscle weakness (generalized) (M62.81)    Time: 8561-8492 PT Time Calculation (min) (ACUTE ONLY): 29 min   Charges:   PT Evaluation $PT Eval Low Complexity: 1 Low PT Treatments $Therapeutic Activity: 8-22 mins PT General Charges $$ ACUTE PT VISIT: 1 Visit         Kate ORN, PT, DPT Secure Chat Preferred  Rehab Office [phone]   Kate BRAVO Wendolyn 09/24/2023, 4:02 PM

## 2023-09-24 NOTE — ED Notes (Signed)
 ED TO INPATIENT HANDOFF REPORT  ED Nurse Name and Phone #: Orland RAMAN Name/Age/Gender Melanie Long 73 y.o. female Room/Bed: 024C/024C  Code Status   Code Status: Not on file  Home/SNF/Other Home Patient oriented to: self Is this baseline? Yes   Triage Complete: Triage complete  Chief Complaint Acute cholecystitis [K81.0]  Triage Note Pt family told EMS pt grabbed her abdomen and was complaining of pain today.  EMS states everywhere they touch pt complains of pain and body aches.  Pt has dementia and it is difficult to communicate with her. Family stated a family member did have a bug recently. EMS gave zofran  4mg  IV 20G right FA VS 136/96 CBG 130 HR 60 O2 94% room air  RR 22   Allergies Allergies  Allergen Reactions   Ciprofloxacin    Elemental Sulfur    Penicillins    Sulfa Antibiotics    Tetanus-Diphth-Acell Pertussis     Level of Care/Admitting Diagnosis ED Disposition     ED Disposition  Admit   Condition  --   Comment  Hospital Area: [facility] [[id number]]  Level of Care: Telemetry Medical [104]  May admit patient to [facility] or [facility] if equivalent level of care is available:: No  Covid Evaluation: Asymptomatic - no recent exposure (last 10 days) testing not required  Diagnosis: Acute cholecystitis [575.0.ICD-9-CM]  Admitting Physician: HOWERTER, JUSTIN B [[id number]]  Attending Physician: HOWERTER, JUSTIN B [[id number]]  Certification:: I certify this patient will need inpatient services for at least 2 midnights  Expected Medical Readiness: 09/26/2023          B Medical/Surgery History Past Medical History:  Diagnosis Date   Anxiety    Dementia (HCC)    Depression    Diverticulitis    Essential hypertension    Hallucinations    High cholesterol    Hypothyroidism    Osteoarthritis    Past Surgical History:  Procedure Laterality Date   BREAST BIOPSY Right 12/27/2019   distortion, ribbon, discordant   COLON  SURGERY     Secondary to acute diverticulitis    COLONOSCOPY     COLONOSCOPY WITH PROPOFOL  N/A 07/16/2021   Procedure: COLONOSCOPY WITH PROPOFOL ;  Surgeon: Unk Corinn Skiff, MD;  Location: [facility] ENDOSCOPY;  Service: Gastroenterology;  Laterality: N/A;  PATIENT HAS DEMENTIA  HUSBAND IS POA - TO COME TO ENDO WITH PATIENT   THYROIDECTOMY       A IV Location/Drains/Wounds Patient Lines/Drains/Airways Status     Active Line/Drains/Airways     Name Placement date Placement time Site Days   Peripheral IV 09/24/23 20 G Right Forearm 09/24/23  0059  Forearm  less than 1            Intake/Output Last 24 hours  Intake/Output Summary (Last 24 hours) at 09/24/2023 0533 Last data filed at 09/24/2023 0519 Gross per 24 hour  Intake 100 ml  Output --  Net 100 ml    Labs/Imaging Results for orders placed or performed during the hospital encounter of 09/23/23 (from the past 48 hours)  CBC with Differential     Status: Abnormal   Collection Time: 09/23/23 10:58 PM  Result Value Ref Range   WBC 8.8 4.0 - 10.5 K/uL   RBC 4.69 3.87 - 5.11 MIL/uL   Hemoglobin 14.5 12.0 - 15.0 g/dL   HCT 56.7 63.9 - 53.9 %   MCV 92.1 80.0 - 100.0 fL   MCH 30.9 26.0 - 34.0 pg   MCHC 33.6  30.0 - 36.0 g/dL   RDW 87.0 88.4 - 84.4 %   Platelets 145 (L) 150 - 400 K/uL    Comment: REPEATED TO VERIFY   nRBC 0.0 0.0 - 0.2 %   Neutrophils Relative % 66 %   Neutro Abs 5.8 1.7 - 7.7 K/uL   Lymphocytes Relative 22 %   Lymphs Abs 2.0 0.7 - 4.0 K/uL   Monocytes Relative 10 %   Monocytes Absolute 0.9 0.1 - 1.0 K/uL   Eosinophils Relative 0 %   Eosinophils Absolute 0.0 0.0 - 0.5 K/uL   Basophils Relative 0 %   Basophils Absolute 0.0 0.0 - 0.1 K/uL   Immature Granulocytes 2 %   Abs Immature Granulocytes 0.14 (H) 0.00 - 0.07 K/uL    Comment: Performed at [facility], [address] [place], [place] [address]  Comprehensive metabolic panel     Status: Abnormal   Collection Time: 09/23/23 10:58 PM  Result  Value Ref Range   Sodium 138 135 - 145 mmol/L   Potassium 4.0 3.5 - 5.1 mmol/L   Chloride 103 98 - 111 mmol/L   CO2 21 (L) 22 - 32 mmol/L   Glucose, Bld 132 (H) 70 - 99 mg/dL    Comment: Glucose reference range applies only to samples taken after fasting for at least 8 hours.   BUN 25 (H) 8 - 23 mg/dL   Creatinine, Ser 9.15 0.44 - 1.00 mg/dL   Calcium  8.9 8.9 - 10.3 mg/dL   Total Protein 6.7 6.5 - 8.1 g/dL   Albumin 3.4 (L) 3.5 - 5.0 g/dL   AST 18 15 - 41 U/L   ALT 14 0 - 44 U/L   Alkaline Phosphatase 53 38 - 126 U/L   Total Bilirubin 0.6 0.0 - 1.2 mg/dL   GFR, Estimated >39 >39 mL/min    Comment: (NOTE) Calculated using the CKD-EPI Creatinine Equation (2021)    Anion gap 14 5 - 15    Comment: Performed at [facility], [address] [place], [place] [address]  Lipase, blood     Status: None   Collection Time: 09/23/23 10:58 PM  Result Value Ref Range   Lipase 29 11 - 51 U/L    Comment: Performed at [facility], [address], [place], [place] [address]  CK     Status: None   Collection Time: 09/23/23 10:58 PM  Result Value Ref Range   Total CK 47 38 - 234 U/L    Comment: Performed at [facility], [address], [place], [place] [address]  Resp panel by RT-PCR (RSV, Flu A&B, Covid) Anterior Nasal Swab     Status: None   Collection Time: 09/23/23 11:04 PM   Specimen: Anterior Nasal Swab  Result Value Ref Range   SARS Coronavirus 2 by RT PCR NEGATIVE NEGATIVE   Influenza A by PCR NEGATIVE NEGATIVE   Influenza B by PCR NEGATIVE NEGATIVE    Comment: (NOTE) The Xpert Xpress SARS-CoV-2/FLU/RSV plus assay is intended as an aid in the diagnosis of influenza from Nasopharyngeal swab specimens and should not be used as a sole basis for treatment. Nasal washings and aspirates are unacceptable for Xpert Xpress SARS-CoV-2/FLU/RSV testing.  Fact Sheet for Patients: bloggercourse.com  Fact Sheet for Healthcare  Providers: seriousbroker.it  This test is not yet approved or cleared by the [place]  FDA and has been authorized for detection and/or diagnosis of SARS-CoV-2 by FDA under an Emergency Use Authorization (  EUA). This EUA will remain in effect (meaning this test can be used) for the duration of the COVID-19 declaration under Section 564(b)(1) of the Act, 21 U.S.C. section 360bbb-3(b)(1), unless the authorization is terminated or revoked.     Resp Syncytial Virus by PCR NEGATIVE NEGATIVE    Comment: (NOTE) Fact Sheet for Patients: bloggercourse.com  Fact Sheet for Healthcare Providers: seriousbroker.it  This test is not yet approved or cleared by the [place]  FDA and has been authorized for detection and/or diagnosis of SARS-CoV-2 by FDA under an Emergency Use Authorization (EUA). This EUA will remain in effect (meaning this test can be used) for the duration of the COVID-19 declaration under Section 564(b)(1) of the Act, 21 U.S.C. section 360bbb-3(b)(1), unless the authorization is terminated or revoked.  Performed at [facility], [address], [place], [place] [address]   Respiratory (~20 pathogens) panel by PCR     Status: None   Collection Time: 09/23/23 11:04 PM   Specimen: Nasopharyngeal Swab; Respiratory  Result Value Ref Range   Adenovirus NOT DETECTED NOT DETECTED   Coronavirus 229E NOT DETECTED NOT DETECTED    Comment: (NOTE) The Coronavirus on the Respiratory Panel, DOES NOT test for the novel  Coronavirus (2019 nCoV)    Coronavirus HKU1 NOT DETECTED NOT DETECTED   Coronavirus NL63 NOT DETECTED NOT DETECTED   Coronavirus OC43 NOT DETECTED NOT DETECTED   Metapneumovirus NOT DETECTED NOT DETECTED   Rhinovirus / Enterovirus NOT DETECTED NOT DETECTED   Influenza A NOT DETECTED NOT DETECTED   Influenza B NOT DETECTED NOT DETECTED   Parainfluenza Virus 1 NOT DETECTED NOT  DETECTED   Parainfluenza Virus 2 NOT DETECTED NOT DETECTED   Parainfluenza Virus 3 NOT DETECTED NOT DETECTED   Parainfluenza Virus 4 NOT DETECTED NOT DETECTED   Respiratory Syncytial Virus NOT DETECTED NOT DETECTED   Bordetella pertussis NOT DETECTED NOT DETECTED   Bordetella Parapertussis NOT DETECTED NOT DETECTED   Chlamydophila pneumoniae NOT DETECTED NOT DETECTED   Mycoplasma pneumoniae NOT DETECTED NOT DETECTED    Comment: Performed at [facility], [address], [place], [place] [address]  Urinalysis, Routine w reflex microscopic -Urine, Clean Catch     Status: Abnormal   Collection Time: 09/24/23 12:44 AM  Result Value Ref Range   Color, Urine YELLOW YELLOW   APPearance CLEAR CLEAR   Specific Gravity, Urine 1.015 1.005 - 1.030   pH 7.0 5.0 - 8.0   Glucose, UA NEGATIVE NEGATIVE mg/dL   Hgb urine dipstick MODERATE (A) NEGATIVE   Bilirubin Urine NEGATIVE NEGATIVE   Ketones, ur NEGATIVE NEGATIVE mg/dL   Protein, ur NEGATIVE NEGATIVE mg/dL   Nitrite NEGATIVE NEGATIVE   Leukocytes,Ua TRACE (A) NEGATIVE   RBC / HPF 11-20 0 - 5 RBC/hpf   WBC, UA 11-20 0 - 5 WBC/hpf   Bacteria, UA NONE SEEN NONE SEEN   Squamous Epithelial / HPF 0-5 0 - 5 /HPF   Mucus PRESENT     Comment: Performed at [facility], [address], [place], [place] [address]   CT ABDOMEN PELVIS W CONTRAST Result Date: 09/24/2023 CLINICAL DATA:  Abdominal pain. EXAM: CT ABDOMEN AND PELVIS WITH CONTRAST TECHNIQUE: Multidetector CT imaging of the abdomen and pelvis was performed using the standard protocol following bolus administration of intravenous contrast. RADIATION DOSE REDUCTION: This exam was performed according to the departmental dose-optimization program which includes automated exposure control, adjustment of the mA and/or kV according to patient size and/or use  of iterative reconstruction technique. CONTRAST:  75mL OMNIPAQUE  IOHEXOL  350 MG/ML SOLN COMPARISON:  None Available. FINDINGS: Lower chest:  Lung bases show posterior atelectasis without infiltrates. Mild elevation of the right diaphragm. The cardiac size is normal. There are scattered single-vessel calcifications in the LAD coronary artery. Minimal pericardial effusion. Hepatobiliary: The liver enhances homogeneously. No mass is seen. No biliary dilatation. The gallbladder is dilated measuring 13 cm in length with wall thickening, pericholecystic fluid and stones. Findings most likely due to acute cholecystitis. There is a 2.8 cm stone in the gallbladder neck which may well be impacted. There are a few more stones more distally. The largest fundal stone is 4 cm. Pancreas: Partially atrophic.  Focal abnormality. Spleen: No mass.  No splenomegaly. Adrenals/Urinary Tract: Adrenal glands are unremarkable. Kidneys are normal, without renal calculi, focal lesion, or hydronephrosis. Bladder is unremarkable. Stomach/Bowel: Small hiatal hernia. Unremarkable stomach. In the abdomen and pelvis proper, the small bowel is normal in caliber. There is a wide mouth supraumbilical midline hernia through an 8 cm wide wall defect containing a portion of the mid transverse colon. Beginning just below this hernia, there is another wide mouth hernia through a 7 cm wide defect, containing a portion of the proximal to mid transverse colon and a loop of the small bowel which is normal caliber. This hernia sac ends just above the umbilicus. At and below the umbilicus, there is wide rectus diastasis as much as 11 cm in diameter and a large complex ventral hernia with the sac extending down as far as the level of the pubic bone, containing the cecum and proximal ascending colon and multiple small bowel segments some of which are mildly dilated up to 2.6 cm. This could indicate ileus or low-grade small bowel obstruction within the hernia although a discrete transition was not convincingly seen. There is a fourth hernia of the anterolateral left lower abdominal wall through a 4 cm  wide defect containing a portion of the distal descending/proximal sigmoid colon, and appears to be nonobstructing. The appendix is surgically absent. No thickening is seen in the bowel wall. No pneumatosis. Vascular/Lymphatic: Aortic atherosclerosis. No enlarged abdominal or pelvic lymph nodes. Reproductive: The uterus is intact. The right ovary is unremarkable. There is a left ovarian versus paraovarian mass measuring 5.0 x 3.2 x 4 cm, homogeneously low in attenuation measuring 27 Hounsfield units. Pelvic ultrasound is recommended. Multiple pelvic phleboliths. Other: No free fluid, free hemorrhage or free air. No edema is seen in the above hernia sacs. Musculoskeletal: There is extensive bridging enthesopathy of the thoracic spine, degenerative changes and spondylosis lumbar spine with advanced facet hypertrophy in the lumbar region from L3 down. Unilateral left pars defect appears chronic at L5 with slight grade 1 L5-S1 spondylolisthesis. No acute or other significant osseous findings. The SI joints are patent. IMPRESSION: 1. Findings consistent with acute cholecystitis, with a 2.8 cm stone in the gallbladder neck which may be impacted. Additional stones up to 4 cm more distally. 2. Aortic and single-vessel coronary artery atherosclerosis. 3. Small hiatal hernia. 4. Four abdominal wall hernias, 2 above the umbilicus, one below the umbilicus, and one in the anterolateral left lower abdominal wall. 5. The 2 most cephalad above hernias contain portions of the transverse colon and small bowel, and the largest ventral hernia contains the cecum and proximal ascending colon and multiple small bowel segments some of which are mildly dilated up to 2.6 cm. This could indicate ileus or low-grade small bowel obstruction within the hernia although  a discrete transition was not convincingly seen. 6. 5.0 x 3.2 x 4 cm left ovarian versus paraovarian mass. Pelvic ultrasound is recommended. 7. Chronic left L5 pars defect with  slight grade 1 L5-S1 spondylolisthesis. 8. These results will be called to the ordering clinician or representative by the Radiologist Assistant, and communication documented in the PACS or [employer]. Aortic Atherosclerosis (ICD10-I70.0). Electronically Signed   By: Francis Quam M.D.   On: 09/24/2023 04:20   DG Chest Port 1 View Result Date: 09/23/2023 CLINICAL DATA:  Altered mental status and body aches. EXAM: PORTABLE CHEST 1 VIEW COMPARISON:  None Available. FINDINGS: The heart size and mediastinal contours are within normal limits. There is scattered aortic calcific plaque. Both lungs are clear with slightly elevated right diaphragm. The visualized skeletal structures are intact, with thoracic spondylosis. IMPRESSION: No evidence of acute chest disease. Aortic atherosclerosis. Electronically Signed   By: Francis Quam M.D.   On: 09/23/2023 23:05    Pending Labs Unresulted Labs (From admission, onward)     Start     Ordered   09/23/23 2234  Urine Culture  Once,   URGENT       Question:  Indication  Answer:  Altered mental status (if no other cause identified)   09/23/23 2233            Vitals/Pain Today's Vitals   09/23/23 2219 09/24/23 0100 09/24/23 0200 09/24/23 0427  BP: (!) 162/83 (!) 163/101 (!) 143/100 (!) 137/112  Pulse: 63 (!) 143 (!) 56 87  Resp:  20 13 17   Temp: 98.6 F (37 C)     TempSrc: Oral     SpO2: 100% 100% 100% 100%  Weight:      Height:      PainSc:        Isolation Precautions Droplet precaution  Medications Medications  fentaNYL  (SUBLIMAZE ) injection 50 mcg (50 mcg Intravenous Given 09/24/23 0059)  ondansetron  (ZOFRAN ) injection 4 mg (4 mg Intravenous Given 09/24/23 0200)  LORazepam  (ATIVAN ) injection 1 mg (1 mg Intravenous Given 09/24/23 0200)  iohexol  (OMNIPAQUE ) 350 MG/ML injection 75 mL (75 mLs Intravenous Contrast Given 09/24/23 0226)  cefTRIAXone  (ROCEPHIN ) 2 g in sodium chloride  0.9 % 100 mL IVPB (0 g Intravenous Stopped 09/24/23 0519)     Mobility non-ambulatory     Focused Assessments Neuro Assessment Handoff:  Swallow screen pass? No          Neuro Assessment:   Neuro Checks:      Has TPA been given? No If patient is a Neuro Trauma and patient is going to OR before floor call report to 4N Charge nurse: [phone] or [phone]   R Recommendations: See Admitting Provider Note  Report given to:   Additional Notes: N/A

## 2023-09-24 NOTE — Plan of Care (Signed)
  Problem: Pain Managment: Goal: General experience of comfort will improve and/or be controlled Outcome: Progressing   Problem: Safety: Goal: Ability to remain free from injury will improve Outcome: Progressing

## 2023-09-24 NOTE — Consult Note (Signed)
 Melanie Long 1951/04/13  [id number].    Requesting MD: Howerter Chief Complaint/Reason for Consult: ?Acute cholecystitis, incisional hernia, L ovarian mass  HPI:  73 y/o F w/ a hx of advanced dementia, HLD, hypothyroidism, and diverticulitis s/p remote Hartmanns with later reversal who was brought to the ED by her caregivers for evaluation of abdominal pain.  Patient's husband is at bedside. He reports that she is verbal but often confused at baseline. She was acting herself during the day on Friday but then in the evening she indicated that she was having pain in the LLQ of her abdomen, prompting a visit to the ED.  She has been tolerating PO and having regular bowel function. She has no known history of an ovarian mass and no cancer history. Last colonoscopy was in 2022 and did not show any polyps or abnormalities.   CT shows signs of acute cholecystitis w/ several large stones, measuring up to 2.8cm in the neck as well as multiple ventral hernias containing colon and small bowel w/ mild SB dilation as well as a left ovarian vs periovarian mass. WBC 8. LFTs and Tbili WNL. UA w/ trace leukocytosis and no bacteria.   ROS: Not able to be obtained due to patient's condition  Family History  Problem Relation Age of Onset   Arthritis Mother    Diabetes Mother    Breast cancer Mother        35's   Diabetes Father    Heart attack Father     Past Medical History:  Diagnosis Date   Anxiety    Dementia (HCC)    Depression    Diverticulitis    Essential hypertension    Hallucinations    High cholesterol    Hypothyroidism    Osteoarthritis     Past Surgical History:  Procedure Laterality Date   BREAST BIOPSY Right 12/27/2019   distortion, ribbon, discordant   COLON SURGERY     Secondary to acute diverticulitis    COLONOSCOPY     COLONOSCOPY WITH PROPOFOL  N/A 07/16/2021   Procedure: COLONOSCOPY WITH PROPOFOL ;  Surgeon: Unk Corinn Skiff, MD;  Location: [facility] ENDOSCOPY;   Service: Gastroenterology;  Laterality: N/A;  PATIENT HAS DEMENTIA  HUSBAND IS POA - TO COME TO ENDO WITH PATIENT   THYROIDECTOMY      Social History:  reports that she has never smoked. She has never used smokeless tobacco. She reports that she does not currently use alcohol. She reports that she does not use drugs.  Allergies:  Allergies  Allergen Reactions   Ciprofloxacin    Elemental Sulfur    Penicillins    Sulfa Antibiotics    Tetanus-Diphth-Acell Pertussis     (Not in a hospital admission)   Physical Exam: Blood pressure (!) 137/112, pulse 87, temperature 98.6 F (37 C), temperature source Oral, resp. rate 17, height 5' 7 (1.702 m), weight 106.1 kg, SpO2 100%. Gen: elderly female, NAD Abd: soft, non-distended, several well healed incisions, mild TTP in the suprapubic and LLQ without rebound/guarding, no peritoneal signs  Results for orders placed or performed during the hospital encounter of 09/23/23 (from the past 48 hours)  CBC with Differential     Status: Abnormal   Collection Time: 09/23/23 10:58 PM  Result Value Ref Range   WBC 8.8 4.0 - 10.5 K/uL   RBC 4.69 3.87 - 5.11 MIL/uL   Hemoglobin 14.5 12.0 - 15.0 g/dL   HCT 56.7 63.9 - 53.9 %   MCV 92.1  80.0 - 100.0 fL   MCH 30.9 26.0 - 34.0 pg   MCHC 33.6 30.0 - 36.0 g/dL   RDW 87.0 88.4 - 84.4 %   Platelets 145 (L) 150 - 400 K/uL    Comment: REPEATED TO VERIFY   nRBC 0.0 0.0 - 0.2 %   Neutrophils Relative % 66 %   Neutro Abs 5.8 1.7 - 7.7 K/uL   Lymphocytes Relative 22 %   Lymphs Abs 2.0 0.7 - 4.0 K/uL   Monocytes Relative 10 %   Monocytes Absolute 0.9 0.1 - 1.0 K/uL   Eosinophils Relative 0 %   Eosinophils Absolute 0.0 0.0 - 0.5 K/uL   Basophils Relative 0 %   Basophils Absolute 0.0 0.0 - 0.1 K/uL   Immature Granulocytes 2 %   Abs Immature Granulocytes 0.14 (H) 0.00 - 0.07 K/uL    Comment: Performed at [facility], [address], [place], [place] [address]  Comprehensive metabolic panel      Status: Abnormal   Collection Time: 09/23/23 10:58 PM  Result Value Ref Range   Sodium 138 135 - 145 mmol/L   Potassium 4.0 3.5 - 5.1 mmol/L   Chloride 103 98 - 111 mmol/L   CO2 21 (L) 22 - 32 mmol/L   Glucose, Bld 132 (H) 70 - 99 mg/dL    Comment: Glucose reference range applies only to samples taken after fasting for at least 8 hours.   BUN 25 (H) 8 - 23 mg/dL   Creatinine, Ser 9.15 0.44 - 1.00 mg/dL   Calcium  8.9 8.9 - 10.3 mg/dL   Total Protein 6.7 6.5 - 8.1 g/dL   Albumin 3.4 (L) 3.5 - 5.0 g/dL   AST 18 15 - 41 U/L   ALT 14 0 - 44 U/L   Alkaline Phosphatase 53 38 - 126 U/L   Total Bilirubin 0.6 0.0 - 1.2 mg/dL   GFR, Estimated >39 >39 mL/min    Comment: (NOTE) Calculated using the CKD-EPI Creatinine Equation (2021)    Anion gap 14 5 - 15    Comment: Performed at [facility], [address], [place], [place] [address]  Lipase, blood     Status: None   Collection Time: 09/23/23 10:58 PM  Result Value Ref Range   Lipase 29 11 - 51 U/L    Comment: Performed at [facility], [address] [place], [place] [address]  CK     Status: None   Collection Time: 09/23/23 10:58 PM  Result Value Ref Range   Total CK 47 38 - 234 U/L    Comment: Performed at [facility], [address] [place], [place] [address]  Resp panel by RT-PCR (RSV, Flu A&B, Covid) Anterior Nasal Swab     Status: None   Collection Time: 09/23/23 11:04 PM   Specimen: Anterior Nasal Swab  Result Value Ref Range   SARS Coronavirus 2 by RT PCR NEGATIVE NEGATIVE   Influenza A by PCR NEGATIVE NEGATIVE   Influenza B by PCR NEGATIVE NEGATIVE    Comment: (NOTE) The Xpert Xpress SARS-CoV-2/FLU/RSV plus assay is intended as an aid in the diagnosis of influenza from Nasopharyngeal swab specimens and should not be used as a sole basis for treatment. Nasal washings and aspirates are unacceptable for Xpert Xpress SARS-CoV-2/FLU/RSV testing.  Fact Sheet for  Patients: bloggercourse.com  Fact Sheet for Healthcare Providers: seriousbroker.it  This test is not yet approved or cleared by the [place]  FDA and  has been authorized for detection and/or diagnosis of SARS-CoV-2 by FDA under an Emergency Use Authorization (EUA). This EUA will remain in effect (meaning this test can be used) for the duration of the COVID-19 declaration under Section 564(b)(1) of the Act, 21 U.S.C. section 360bbb-3(b)(1), unless the authorization is terminated or revoked.     Resp Syncytial Virus by PCR NEGATIVE NEGATIVE    Comment: (NOTE) Fact Sheet for Patients: bloggercourse.com  Fact Sheet for Healthcare Providers: seriousbroker.it  This test is not yet approved or cleared by the [place]  FDA and has been authorized for detection and/or diagnosis of SARS-CoV-2 by FDA under an Emergency Use Authorization (EUA). This EUA will remain in effect (meaning this test can be used) for the duration of the COVID-19 declaration under Section 564(b)(1) of the Act, 21 U.S.C. section 360bbb-3(b)(1), unless the authorization is terminated or revoked.  Performed at [facility], [address] [place], [place] [address]   Respiratory (~20 pathogens) panel by PCR     Status: None   Collection Time: 09/23/23 11:04 PM   Specimen: Nasopharyngeal Swab; Respiratory  Result Value Ref Range   Adenovirus NOT DETECTED NOT DETECTED   Coronavirus 229E NOT DETECTED NOT DETECTED    Comment: (NOTE) The Coronavirus on the Respiratory Panel, DOES NOT test for the novel  Coronavirus (2019 nCoV)    Coronavirus HKU1 NOT DETECTED NOT DETECTED   Coronavirus NL63 NOT DETECTED NOT DETECTED   Coronavirus OC43 NOT DETECTED NOT DETECTED   Metapneumovirus NOT DETECTED NOT DETECTED   Rhinovirus / Enterovirus NOT DETECTED NOT DETECTED   Influenza A NOT DETECTED NOT DETECTED    Influenza B NOT DETECTED NOT DETECTED   Parainfluenza Virus 1 NOT DETECTED NOT DETECTED   Parainfluenza Virus 2 NOT DETECTED NOT DETECTED   Parainfluenza Virus 3 NOT DETECTED NOT DETECTED   Parainfluenza Virus 4 NOT DETECTED NOT DETECTED   Respiratory Syncytial Virus NOT DETECTED NOT DETECTED   Bordetella pertussis NOT DETECTED NOT DETECTED   Bordetella Parapertussis NOT DETECTED NOT DETECTED   Chlamydophila pneumoniae NOT DETECTED NOT DETECTED   Mycoplasma pneumoniae NOT DETECTED NOT DETECTED    Comment: Performed at [facility], [address], [place], [place] [address]  Urinalysis, Routine w reflex microscopic -Urine, Clean Catch     Status: Abnormal   Collection Time: 09/24/23 12:44 AM  Result Value Ref Range   Color, Urine YELLOW YELLOW   APPearance CLEAR CLEAR   Specific Gravity, Urine 1.015 1.005 - 1.030   pH 7.0 5.0 - 8.0   Glucose, UA NEGATIVE NEGATIVE mg/dL   Hgb urine dipstick MODERATE (A) NEGATIVE   Bilirubin Urine NEGATIVE NEGATIVE   Ketones, ur NEGATIVE NEGATIVE mg/dL   Protein, ur NEGATIVE NEGATIVE mg/dL   Nitrite NEGATIVE NEGATIVE   Leukocytes,Ua TRACE (A) NEGATIVE   RBC / HPF 11-20 0 - 5 RBC/hpf   WBC, UA 11-20 0 - 5 WBC/hpf   Bacteria, UA NONE SEEN NONE SEEN   Squamous Epithelial / HPF 0-5 0 - 5 /HPF   Mucus PRESENT     Comment: Performed at [facility], [address] [place], [place] [address]   CT ABDOMEN PELVIS W CONTRAST Result Date: 09/24/2023 CLINICAL DATA:  Abdominal pain. EXAM: CT ABDOMEN AND PELVIS WITH CONTRAST TECHNIQUE: Multidetector CT imaging of the abdomen and pelvis was performed using the standard protocol following bolus administration of intravenous contrast. RADIATION DOSE REDUCTION: This exam was performed according to the departmental dose-optimization program which  includes automated exposure control, adjustment of the mA and/or kV according to patient size and/or use of iterative reconstruction technique. CONTRAST:  75mL  OMNIPAQUE  IOHEXOL  350 MG/ML SOLN COMPARISON:  None Available. FINDINGS: Lower chest: Lung bases show posterior atelectasis without infiltrates. Mild elevation of the right diaphragm. The cardiac size is normal. There are scattered single-vessel calcifications in the LAD coronary artery. Minimal pericardial effusion. Hepatobiliary: The liver enhances homogeneously. No mass is seen. No biliary dilatation. The gallbladder is dilated measuring 13 cm in length with wall thickening, pericholecystic fluid and stones. Findings most likely due to acute cholecystitis. There is a 2.8 cm stone in the gallbladder neck which may well be impacted. There are a few more stones more distally. The largest fundal stone is 4 cm. Pancreas: Partially atrophic.  Focal abnormality. Spleen: No mass.  No splenomegaly. Adrenals/Urinary Tract: Adrenal glands are unremarkable. Kidneys are normal, without renal calculi, focal lesion, or hydronephrosis. Bladder is unremarkable. Stomach/Bowel: Small hiatal hernia. Unremarkable stomach. In the abdomen and pelvis proper, the small bowel is normal in caliber. There is a wide mouth supraumbilical midline hernia through an 8 cm wide wall defect containing a portion of the mid transverse colon. Beginning just below this hernia, there is another wide mouth hernia through a 7 cm wide defect, containing a portion of the proximal to mid transverse colon and a loop of the small bowel which is normal caliber. This hernia sac ends just above the umbilicus. At and below the umbilicus, there is wide rectus diastasis as much as 11 cm in diameter and a large complex ventral hernia with the sac extending down as far as the level of the pubic bone, containing the cecum and proximal ascending colon and multiple small bowel segments some of which are mildly dilated up to 2.6 cm. This could indicate ileus or low-grade small bowel obstruction within the hernia although a discrete transition was not convincingly seen.  There is a fourth hernia of the anterolateral left lower abdominal wall through a 4 cm wide defect containing a portion of the distal descending/proximal sigmoid colon, and appears to be nonobstructing. The appendix is surgically absent. No thickening is seen in the bowel wall. No pneumatosis. Vascular/Lymphatic: Aortic atherosclerosis. No enlarged abdominal or pelvic lymph nodes. Reproductive: The uterus is intact. The right ovary is unremarkable. There is a left ovarian versus paraovarian mass measuring 5.0 x 3.2 x 4 cm, homogeneously low in attenuation measuring 27 Hounsfield units. Pelvic ultrasound is recommended. Multiple pelvic phleboliths. Other: No free fluid, free hemorrhage or free air. No edema is seen in the above hernia sacs. Musculoskeletal: There is extensive bridging enthesopathy of the thoracic spine, degenerative changes and spondylosis lumbar spine with advanced facet hypertrophy in the lumbar region from L3 down. Unilateral left pars defect appears chronic at L5 with slight grade 1 L5-S1 spondylolisthesis. No acute or other significant osseous findings. The SI joints are patent. IMPRESSION: 1. Findings consistent with acute cholecystitis, with a 2.8 cm stone in the gallbladder neck which may be impacted. Additional stones up to 4 cm more distally. 2. Aortic and single-vessel coronary artery atherosclerosis. 3. Small hiatal hernia. 4. Four abdominal wall hernias, 2 above the umbilicus, one below the umbilicus, and one in the anterolateral left lower abdominal wall. 5. The 2 most cephalad above hernias contain portions of the transverse colon and small bowel, and the largest ventral hernia contains the cecum and proximal ascending colon and multiple small bowel segments some of which are mildly dilated up  to 2.6 cm. This could indicate ileus or low-grade small bowel obstruction within the hernia although a discrete transition was not convincingly seen. 6. 5.0 x 3.2 x 4 cm left ovarian versus  paraovarian mass. Pelvic ultrasound is recommended. 7. Chronic left L5 pars defect with slight grade 1 L5-S1 spondylolisthesis. 8. These results will be called to the ordering clinician or representative by the Radiologist Assistant, and communication documented in the PACS or [employer]. Aortic Atherosclerosis (ICD10-I70.0). Electronically Signed   By: Francis Quam M.D.   On: 09/24/2023 04:20   DG Chest Port 1 View Result Date: 09/23/2023 CLINICAL DATA:  Altered mental status and body aches. EXAM: PORTABLE CHEST 1 VIEW COMPARISON:  None Available. FINDINGS: The heart size and mediastinal contours are within normal limits. There is scattered aortic calcific plaque. Both lungs are clear with slightly elevated right diaphragm. The visualized skeletal structures are intact, with thoracic spondylosis. IMPRESSION: No evidence of acute chest disease. Aortic atherosclerosis. Electronically Signed   By: Francis Quam M.D.   On: 09/23/2023 23:05    Assessment/Plan 73 y/o F w/ a hx of severe dementia who presented with abdominal pain and has a CT showing signs of acute cholecystitis, a newly discovered ovarian vs periovarian mass, as well multiple abdominal wall hernias w/ mild SB dilation   ?Acute cholecystitis - No WBC, No RUQ pain, and LFTs WNL.  She is high risk for complication and/or requiring an open operation. Continue abx for now and will discuss possible perc chole tube with rounding team ?SBO - She is not clinically obstructed and has been tolerating PO per her husband. If she develops N/V or distention then would recommend NGT placement L Ovarian vs. Periovarian Mass - New finding per husband. Last colonoscopy reportedly did not show abnormalities. Will need pelvic US  to better characterize.    FEN - NPO for now VTE - Holding for possible procedure ID - Rocephin  Admit to medicine, surgery will follow along  Cordella DELENA Polly Marlis Cheron Surgery 09/24/2023, 6:18 AM Please see Amion  for pager number during day hours 7:00am-4:30pm or 7:00am -11:30am on weekends

## 2023-09-24 NOTE — Progress Notes (Signed)
 Cardiac monitoring called to CCMD spoke with T[place]. Pt is on box [id number]. Lovella Rubin, NT, second verifier.

## 2023-09-24 NOTE — Progress Notes (Signed)
  Carryover admission to the Day Admitter.  I discussed this case with the EDP, Olam Slocumb, PA.  Per these discussions:   This is a 73 year old female with advanced dementia, who is being admitted with acute cholecystitis as well as suspected small bowel obstruction within ventral hernia after presenting with 1 day of abdominal discomfort associated nausea and dry heaves.   Ensuing CT abdomen/pelvis was suggestive of acute cholecystitis as well as obstruction within ventral hernia.   EDP has d/w on-call general surgery, Dr.Metzger, who will formally consult; in the setting of the patient's advanced dementia, Dr. Polly will be discussing options for surgical versus conservative measures with the patient's POA (patient's son).   The patient has been afebrile throughout ED course.  Received a dose of Rocephin  in the ED.  I have placed an order for inpatient admission to med/tele for further evaluation management of the above.  I have placed some additional preliminary admit orders via the adult multi-morbid admission order set. I have also ordered as needed Zofran , as needed IV fentanyl .  I have also kept the patient n.p.o.    Eva Pore, DO Hospitalist

## 2023-09-24 NOTE — H&P (Addendum)
 [facility] H&P   Patient Demographics:    Melanie Long, is a 73 y.o. female  MRN: [id number]   DOB - [birth year]  Admit Date - 09/23/2023  Outpatient Primary MD for the patient is Gretta Comer POUR, NP  Referring MD/NP/PA: DORISTINE Slocumb  Patient coming from: home  Chief Complaint  Patient presents with   Generalized Body Aches   Abdominal Pain      HPI:    Melanie Long  is a 73 y.o. female, significant past medical history of advanced dementia, hypothyroidism, hyperlipidemia, seizures, history of diverticulitis status post partial colectomy and revision of colostomy. -Patient is wheelchair dependent at baseline, full assist with all her activities, full assistant with feeding as well, husband has been been taking care of her. -Patient is in fact demented, unable to provide any history, it all has been provided by husband at bedside, apparently he noted her having some abdominal pain and discomfort, which prompted them to bring her to ED, denies her having any nausea, vomiting, fever, chills, reports she is having good BMs and flatus. -In ED CT abdomen was obtained, showing acute cholecystitis, with cholelithiasis, as well as multiple ventral hernias concerning: And small bowel with mild small bowel dilation, as well left ovarian versus periovarian mass, she was with normal blood cell count, afebrile, LFTs and total bili within normal limit,    Review of systems:    Patient with advanced dementia unable to provide any history  With Past History of the following :    Past Medical History:  Diagnosis Date   Anxiety    Dementia (HCC)    Depression    Diverticulitis    Essential hypertension    Hallucinations    High cholesterol    Hypothyroidism    Osteoarthritis       Past Surgical History:  Procedure Laterality Date   BREAST BIOPSY Right 12/27/2019   distortion, ribbon,  discordant   COLON SURGERY     Secondary to acute diverticulitis    COLONOSCOPY     COLONOSCOPY WITH PROPOFOL  N/A 07/16/2021   Procedure: COLONOSCOPY WITH PROPOFOL ;  Surgeon: Unk Corinn Skiff, MD;  Location: [facility] ENDOSCOPY;  Service: Gastroenterology;  Laterality: N/A;  PATIENT HAS DEMENTIA  HUSBAND IS POA - TO COME TO ENDO WITH PATIENT   THYROIDECTOMY        Social History:     Social History   Tobacco Use   Smoking status: Never   Smokeless tobacco: Never  Substance Use Topics   Alcohol use: Not Currently        Family History :     Family History  Problem Relation Age of Onset   Arthritis Mother    Diabetes Mother    Breast cancer Mother        34's   Diabetes Father    Heart attack Father       Home  Medications:   Prior to Admission medications   Medication Sig Start Date End Date Taking? Authorizing Provider  aspirin 325 MG tablet Take 325 mg by mouth daily.    [provider]  atorvastatin  (LIPITOR) 10 MG tablet Take 1 tablet (10 mg total) by mouth daily. for cholesterol. 02/03/23   Gretta Comer POUR, NP  benazepril  (LOTENSIN ) 20 MG tablet Take 1 tablet (20 mg total) by mouth daily. for blood pressure. 12/14/22   Clark, Katherine K, NP  cholecalciferol (VITAMIN D) 25 MCG (1000 UNIT) tablet Take 400 Units by mouth daily. Patient not taking: Reported on 05/17/2023    [provider]  citalopram  (CELEXA ) 20 MG tablet TAKE 1 TABLET(20 MG) BY MOUTH DAILY FOR ANXIETY 03/02/23   Clark, Katherine K, NP  divalproex  (DEPAKOTE  ER) 250 MG 24 hr tablet Take 1 tablet (250 mg total) by mouth daily. 04/14/23 04/08/24  Gregg Lek, MD  divalproex  (DEPAKOTE  ER) 500 MG 24 hr tablet Take 1 tablet (500 mg total) by mouth daily. 04/14/23 04/08/24  Gregg Lek, MD  hydrOXYzine  (ATARAX ) 10 MG tablet Take 1-2 tablets (10-20 mg total) by mouth 2 (two) times daily as needed for anxiety. 07/04/23   Clark, Katherine K, NP  levothyroxine  (SYNTHROID ) 125 MCG tablet TAKE  ONE TABLET BY MOUTH EVERY MORNING ON AN EMPTY STOMACH WITH WATER ONLY.. NO FOOD OR OTHER MEDICATION FOR 30 MINUTES 12/14/22   Clark, Katherine K, NP  memantine  (NAMENDA ) 10 MG tablet Take 1 tablet (10 mg total) by mouth 2 (two) times daily. 04/14/23 04/08/24  Gregg Lek, MD  Omega-3 Fatty Acids (FISH OIL) 1000 MG CAPS Take 1,000 mg by mouth daily.    [provider]  traZODone  (DESYREL ) 50 MG tablet TAKE 1 TABLET(50 MG) BY MOUTH AT BEDTIME FOR SLEEP 03/10/23   Clark, Katherine K, NP     Allergies:     Allergies  Allergen Reactions   Ciprofloxacin    Elemental Sulfur    Penicillins    Sulfa Antibiotics    Tetanus-Diphth-Acell Pertussis      Physical Exam:   Vitals  Blood pressure (!) 153/60, pulse 70, temperature 98.5 F (36.9 C), temperature source Oral, resp. rate 17, height 5' 7 (1.702 m), weight 106.1 kg, SpO2 100%.   1. General Frail, ill-appearing female, laying in bed in no apparent distress  2.  With advanced dementia, awake, but unable to answer any questions or follow any commands  3. No F.N deficits, ALL C.Nerves Intact, appears to be moving all extremities without any deficits.   4. Ears and Eyes appear Normal, Conjunctivae clear, PERRLA. Moist Oral Mucosa.  5. Supple Neck, No JVD, No cervical lymphadenopathy appriciated, No Carotid Bruits.  6. Symmetrical Chest wall movement, Good air movement bilaterally, CTAB.  7. RRR, No Gallops, Rubs or Murmurs, No Parasternal Heave.  8. Positive Bowel Sounds, Abdomen Soft, has minimal tenderness to palpation in suprapubic and left lower quadrant, no rebound, no guarding, no peritoneal signs   9.  No Cyanosis, Normal Skin Turgor, No Skin Rash or Bruise.  10. Good muscle tone,  joints appear normal , no effusions, Normal ROM.     Data Review:    CBC Recent Labs  Lab 09/23/23 2258  WBC 8.8  HGB 14.5  HCT 43.2  PLT 145*  MCV 92.1  MCH 30.9  MCHC 33.6  RDW 12.9  LYMPHSABS 2.0  MONOABS 0.9  EOSABS  0.0  BASOSABS 0.0   ------------------------------------------------------------------------------------------------------------------  Chemistries  Recent Labs  Lab  09/23/23 2258  NA 138  K 4.0  CL 103  CO2 21*  GLUCOSE 132*  BUN 25*  CREATININE 0.84  CALCIUM  8.9  AST 18  ALT 14  ALKPHOS 53  BILITOT 0.6   ------------------------------------------------------------------------------------------------------------------ estimated creatinine clearance is 78.1 mL/min (by C-G formula based on SCr of 0.84 mg/dL). ------------------------------------------------------------------------------------------------------------------ No results for input(s): TSH, T4TOTAL, T3FREE, THYROIDAB in the last 72 hours.  Invalid input(s): FREET3  Coagulation profile No results for input(s): INR, PROTIME in the last 168 hours. ------------------------------------------------------------------------------------------------------------------- No results for input(s): DDIMER in the last 72 hours. -------------------------------------------------------------------------------------------------------------------  Cardiac Enzymes No results for input(s): CKMB, TROPONINI, MYOGLOBIN in the last 168 hours.  Invalid input(s): CK ------------------------------------------------------------------------------------------------------------------ No results found for: BNP   ---------------------------------------------------------------------------------------------------------------  Urinalysis    Component Value Date/Time   COLORURINE YELLOW 09/24/2023 0044   APPEARANCEUR CLEAR 09/24/2023 0044   LABSPEC 1.015 09/24/2023 0044   PHURINE 7.0 09/24/2023 0044   GLUCOSEU NEGATIVE 09/24/2023 0044   HGBUR MODERATE (A) 09/24/2023 0044   BILIRUBINUR NEGATIVE 09/24/2023 0044   BILIRUBINUR neg 05/18/2023 1703   KETONESUR NEGATIVE 09/24/2023 0044   PROTEINUR NEGATIVE 09/24/2023 0044    UROBILINOGEN 0.2 05/18/2023 1703   NITRITE NEGATIVE 09/24/2023 0044   LEUKOCYTESUR TRACE (A) 09/24/2023 0044    ----------------------------------------------------------------------------------------------------------------   Imaging Results:    CT ABDOMEN PELVIS W CONTRAST Result Date: 09/24/2023 CLINICAL DATA:  Abdominal pain. EXAM: CT ABDOMEN AND PELVIS WITH CONTRAST TECHNIQUE: Multidetector CT imaging of the abdomen and pelvis was performed using the standard protocol following bolus administration of intravenous contrast. RADIATION DOSE REDUCTION: This exam was performed according to the departmental dose-optimization program which includes automated exposure control, adjustment of the mA and/or kV according to patient size and/or use of iterative reconstruction technique. CONTRAST:  75mL OMNIPAQUE  IOHEXOL  350 MG/ML SOLN COMPARISON:  None Available. FINDINGS: Lower chest: Lung bases show posterior atelectasis without infiltrates. Mild elevation of the right diaphragm. The cardiac size is normal. There are scattered single-vessel calcifications in the LAD coronary artery. Minimal pericardial effusion. Hepatobiliary: The liver enhances homogeneously. No mass is seen. No biliary dilatation. The gallbladder is dilated measuring 13 cm in length with wall thickening, pericholecystic fluid and stones. Findings most likely due to acute cholecystitis. There is a 2.8 cm stone in the gallbladder neck which may well be impacted. There are a few more stones more distally. The largest fundal stone is 4 cm. Pancreas: Partially atrophic.  Focal abnormality. Spleen: No mass.  No splenomegaly. Adrenals/Urinary Tract: Adrenal glands are unremarkable. Kidneys are normal, without renal calculi, focal lesion, or hydronephrosis. Bladder is unremarkable. Stomach/Bowel: Small hiatal hernia. Unremarkable stomach. In the abdomen and pelvis proper, the small bowel is normal in caliber. There is a wide mouth supraumbilical  midline hernia through an 8 cm wide wall defect containing a portion of the mid transverse colon. Beginning just below this hernia, there is another wide mouth hernia through a 7 cm wide defect, containing a portion of the proximal to mid transverse colon and a loop of the small bowel which is normal caliber. This hernia sac ends just above the umbilicus. At and below the umbilicus, there is wide rectus diastasis as much as 11 cm in diameter and a large complex ventral hernia with the sac extending down as far as the level of the pubic bone, containing the cecum and proximal ascending colon and multiple small bowel segments some of which are mildly dilated up to 2.6 cm. This could indicate ileus or low-grade small  bowel obstruction within the hernia although a discrete transition was not convincingly seen. There is a fourth hernia of the anterolateral left lower abdominal wall through a 4 cm wide defect containing a portion of the distal descending/proximal sigmoid colon, and appears to be nonobstructing. The appendix is surgically absent. No thickening is seen in the bowel wall. No pneumatosis. Vascular/Lymphatic: Aortic atherosclerosis. No enlarged abdominal or pelvic lymph nodes. Reproductive: The uterus is intact. The right ovary is unremarkable. There is a left ovarian versus paraovarian mass measuring 5.0 x 3.2 x 4 cm, homogeneously low in attenuation measuring 27 Hounsfield units. Pelvic ultrasound is recommended. Multiple pelvic phleboliths. Other: No free fluid, free hemorrhage or free air. No edema is seen in the above hernia sacs. Musculoskeletal: There is extensive bridging enthesopathy of the thoracic spine, degenerative changes and spondylosis lumbar spine with advanced facet hypertrophy in the lumbar region from L3 down. Unilateral left pars defect appears chronic at L5 with slight grade 1 L5-S1 spondylolisthesis. No acute or other significant osseous findings. The SI joints are patent. IMPRESSION: 1.  Findings consistent with acute cholecystitis, with a 2.8 cm stone in the gallbladder neck which may be impacted. Additional stones up to 4 cm more distally. 2. Aortic and single-vessel coronary artery atherosclerosis. 3. Small hiatal hernia. 4. Four abdominal wall hernias, 2 above the umbilicus, one below the umbilicus, and one in the anterolateral left lower abdominal wall. 5. The 2 most cephalad above hernias contain portions of the transverse colon and small bowel, and the largest ventral hernia contains the cecum and proximal ascending colon and multiple small bowel segments some of which are mildly dilated up to 2.6 cm. This could indicate ileus or low-grade small bowel obstruction within the hernia although a discrete transition was not convincingly seen. 6. 5.0 x 3.2 x 4 cm left ovarian versus paraovarian mass. Pelvic ultrasound is recommended. 7. Chronic left L5 pars defect with slight grade 1 L5-S1 spondylolisthesis. 8. These results will be called to the ordering clinician or representative by the Radiologist Assistant, and communication documented in the PACS or [employer]. Aortic Atherosclerosis (ICD10-I70.0). Electronically Signed   By: Francis Quam M.D.   On: 09/24/2023 04:20   DG Chest Port 1 View Result Date: 09/23/2023 CLINICAL DATA:  Altered mental status and body aches. EXAM: PORTABLE CHEST 1 VIEW COMPARISON:  None Available. FINDINGS: The heart size and mediastinal contours are within normal limits. There is scattered aortic calcific plaque. Both lungs are clear with slightly elevated right diaphragm. The visualized skeletal structures are intact, with thoracic spondylosis. IMPRESSION: No evidence of acute chest disease. Aortic atherosclerosis. Electronically Signed   By: Francis Quam M.D.   On: 09/23/2023 23:05    EKG:  Vent. rate 75 BPM PR interval 166 ms QRS duration 101 ms QT/QTcB 427/477 ms P-R-T axes 72 -9 59 Sinus rhythm   Assessment & Plan:    Principal Problem:    Acute cholecystitis Active Problems:   Postoperative hypothyroidism   Essential hypertension   Dementia (HCC)   Mixed hyperlipidemia   Acute cholecystitis Cholelithiasis -Concerning for acute cholecystitis, general surgery input greatly appreciated, clinically patient with no leukocytosis, afebrile, LFTs within normal limit, nontender in right upper quadrant area,. -Continue with IV antibiotics Rocephin  and Flagyl  for now. -Management per general surgery, keep n.p.o., will await further recommendations if percutaneous drainage is indicated  Questionable small bowel obstruction on imaging -Management per general surgery, clinically does not appear to be an SBO, as no nausea, no vomiting,  tolerating p.o. intake, good BM and flatus. -Hold on NG tube for now  Left ovarian versus paraovarian mass -Colonoscopy 2022 with no acute findings, will proceed with pelvic ultrasound for better characterization  Hypertension -Blood pressure mildly elevated, resume home meds and add as needed hydralazine   History of seizures -Resume home Depakote   Advanced dementia -Continue with supportive care -Resume home medications when stable including Namenda , as needed Atarax , Celexa   Hyperlipidemia -Resume statin when stable  Hypo-thyroidism -continue with Synthroid    DVT Prophylaxis Heparin   AM Labs Ordered, also please review Full Orders  Family Communication: Admission, patients condition and plan of care including tests being ordered have been discussed with the patient and husband at bedside* who indicate understanding and agree with the plan and Code Status.  Code Status full code for now,   Likely DC to home  Consults called: General Surgery  Admission status: Inpatient  Time spent in minutes : 70 minutes   Brayton Lye M.D on 09/24/2023 at 11:02 AM   [facility] - Office  [phone]

## 2023-09-25 ENCOUNTER — Inpatient Hospital Stay (HOSPITAL_COMMUNITY): Payer: Medicare Other

## 2023-09-25 DIAGNOSIS — K810 Acute cholecystitis: Secondary | ICD-10-CM | POA: Diagnosis not present

## 2023-09-25 LAB — COMPREHENSIVE METABOLIC PANEL
ALT: 12 U/L (ref 0–44)
AST: 21 U/L (ref 15–41)
Albumin: 2.9 g/dL — ABNORMAL LOW (ref 3.5–5.0)
Alkaline Phosphatase: 50 U/L (ref 38–126)
Anion gap: 11 (ref 5–15)
BUN: 15 mg/dL (ref 8–23)
CO2: 24 mmol/L (ref 22–32)
Calcium: 8.4 mg/dL — ABNORMAL LOW (ref 8.9–10.3)
Chloride: 103 mmol/L (ref 98–111)
Creatinine, Ser: 0.79 mg/dL (ref 0.44–1.00)
GFR, Estimated: >60 mL/min (ref >60)
Glucose, Bld: 113 mg/dL — ABNORMAL HIGH (ref 70–99)
Potassium: 3.6 mmol/L (ref 3.5–5.1)
Sodium: 138 mmol/L (ref 135–145)
Total Bilirubin: 0.6 mg/dL (ref 0.0–1.2)
Total Protein: 6.2 g/dL — ABNORMAL LOW (ref 6.5–8.1)

## 2023-09-25 LAB — CBC
HCT: 44.5 % (ref 36.0–46.0)
Hemoglobin: 14.9 g/dL (ref 12.0–15.0)
MCH: 30.5 pg (ref 26.0–34.0)
MCHC: 33.5 g/dL (ref 30.0–36.0)
MCV: 91 fL (ref 80.0–100.0)
Platelets: 126 10*3/uL — ABNORMAL LOW (ref 150–400)
RBC: 4.89 MIL/uL (ref 3.87–5.11)
RDW: 13.3 % (ref 11.5–15.5)
WBC: 17.1 10*3/uL — ABNORMAL HIGH (ref 4.0–10.5)
nRBC: 0 % (ref 0.0–0.2)

## 2023-09-25 MED ORDER — LACTATED RINGERS IV SOLN: INTRAVENOUS | Status: DC

## 2023-09-25 MED ORDER — GUAIFENESIN-DM 100-10 MG/5ML PO SYRP
5.0000 mL | ORAL_SOLUTION | ORAL | Status: DC | PRN
  Administered 2023-09-25: 5 mL via ORAL
  Filled 2023-09-25: qty 5

## 2023-09-25 MED ORDER — MEMANTINE HCL 10 MG PO TABS
10.0000 mg | ORAL_TABLET | Freq: Two times a day (BID) | ORAL | Status: DC
  Administered 2023-09-25 – 2023-09-27 (×6): 10 mg via ORAL
  Filled 2023-09-25 (×7): qty 1

## 2023-09-25 NOTE — Plan of Care (Signed)
  Problem: Pain Managment: Goal: General experience of comfort will improve and/or be controlled Outcome: Progressing   Problem: Safety: Goal: Ability to remain free from injury will improve Outcome: Progressing

## 2023-09-25 NOTE — Evaluation (Signed)
 Occupational Therapy Evaluation Patient Details Name: Melanie Long MRN: [id number] DOB: 04/24/1951 Today's Date: 09/25/2023   History of Present Illness 73 y.o. female presents [facility]/23/23 with generalized body aches and abdominal pain. Abdomen CT showed acute cholecystitis with cholelithiasis, multiple ventral hernias, small bowel dilation, L ovarian versus periovarian mass. PMHx: WC dependent at baseline, advanced dementia, seizures, HTN, OA   Clinical Impression   Per daughter, pt needs cognitive assist for most ADLs, HHA for mobility in the home, uses w/c in community. Pt currently needing min +2 for steps to chair with HHA, min -max A for ADLs, and mod A for bed mobility, pt benefitting from cues for sequencing throughout. Pt presenting with impairments listed below, will follow acutely. Recommend HHOT at d/c.       If plan is discharge home, recommend the following: A little help with walking and/or transfers;A lot of help with bathing/dressing/bathroom;Assistance with cooking/housework;Direct supervision/assist for financial management;Direct supervision/assist for medications management;Assist for transportation;Help with stairs or ramp for entrance;Supervision due to cognitive status    Functional Status Assessment  Patient has had a recent decline in their functional status and demonstrates the ability to make significant improvements in function in a reasonable and predictable amount of time.  Equipment Recommendations  BSC/3in1    Recommendations for Other Services PT consult     Precautions / Restrictions Precautions Precautions: Fall Restrictions Weight Bearing Restrictions Per Provider Order: No      Mobility Bed Mobility Overal bed mobility: Needs Assistance Bed Mobility: Supine to Sit, Sit to Supine     Supine to sit: Mod assist     General bed mobility comments: mod A to scoot hips to EOB    Transfers Overall transfer level: Needs assistance Equipment  used: 2 person hand held assist Transfers: Sit to/from Stand Sit to Stand: Min assist, +2 physical assistance           General transfer comment: min A with HHA to transfer to chair      Balance Overall balance assessment: Needs assistance, Mild deficits observed, not formally tested Sitting-balance support: Bilateral upper extremity supported, Feet supported Sitting balance-Leahy Scale: Fair Sitting balance - Comments: CGA for safety   Standing balance support: Bilateral upper extremity supported, During functional activity, Reliant on assistive device for balance Standing balance-Leahy Scale: Fair                             ADL either performed or assessed with clinical judgement   ADL Overall ADL's : Needs assistance/impaired Eating/Feeding: Minimal assistance   Grooming: Minimal assistance   Upper Body Bathing: Moderate assistance   Lower Body Bathing: Maximal assistance   Upper Body Dressing : Moderate assistance   Lower Body Dressing: Maximal assistance   Toilet Transfer: Minimal assistance;Stand-pivot;BSC/3in1   Toileting- Clothing Manipulation and Hygiene: Minimal assistance       Functional mobility during ADLs: Minimal assistance       Vision Baseline Vision/History: 1 Wears glasses Vision Assessment?: No apparent visual deficits     Perception Perception: Not tested       Praxis Praxis: Not tested       Pertinent Vitals/Pain Pain Assessment Pain Assessment: No/denies pain     Extremity/Trunk Assessment Upper Extremity Assessment Upper Extremity Assessment: Generalized weakness   Lower Extremity Assessment Lower Extremity Assessment: Defer to PT evaluation   Cervical / Trunk Assessment Cervical / Trunk Assessment: Kyphotic   Communication Communication Communication: Difficulty communicating  thoughts/reduced clarity of speech   Cognition Arousal: Alert Behavior During Therapy: Flat affect Overall Cognitive Status:  History of cognitive impairments - at baseline                                 General Comments: A&Ox1, family reports pt is at cognitive baseline. Follows single step commands inconsistently and with increased time. Responds better to family members     General Comments  VSS on RA, daughter present    Exercises     Shoulder Instructions      Home Living Family/patient expects to be discharged to:: Private residence Living Arrangements: Spouse/significant other;Children (at daughter's home during the day 5-6 hours/day) Available Help at Discharge: Family;Available 24 hours/day Type of Home: House Home Access: Ramped entrance;Stairs to enter Entrance Stairs-Number of Steps: 1 Entrance Stairs-Rails: Left Home Layout: One level     Bathroom Shower/Tub: Producer, Television/film/video: Handicapped height Bathroom Accessibility: Yes   Home Equipment: Shower seat - built in;Grab bars - tub/shower;Grab bars - toilet;Wheelchair - manual          Prior Functioning/Environment Prior Level of Function : Needs assist             Mobility Comments: w/c for community  mobility, around the house has HHA ADLs Comments: physical/cognitive assists for ADLs due to cog deficits        OT Problem List: Decreased strength;Decreased range of motion;Decreased activity tolerance;Impaired balance (sitting and/or standing);Decreased coordination;Decreased safety awareness;Decreased cognition      OT Treatment/Interventions: Self-care/ADL training;Therapeutic exercise;Energy conservation;DME and/or AE instruction;Therapeutic activities;Patient/family education;Visual/perceptual remediation/compensation    OT Goals(Current goals can be found in the care plan section) Acute Rehab OT Goals Patient Stated Goal: none stated OT Goal Formulation: With patient/family Time For Goal Achievement: 10/09/23 Potential to Achieve Goals: Good ADL Goals Pt Will Perform Upper Body  Dressing: with supervision;sitting Pt Will Perform Lower Body Dressing: with supervision;sit to/from stand;sitting/lateral leans Pt Will Transfer to Toilet: with supervision;ambulating;regular height toilet  OT Frequency: Min 1X/week    Co-evaluation              AM-PAC OT 6 Clicks Daily Activity     Outcome Measure Help from another person eating meals?: A Little Help from another person taking care of personal grooming?: A Little Help from another person toileting, which includes using toliet, bedpan, or urinal?: A Lot Help from another person bathing (including washing, rinsing, drying)?: A Lot Help from another person to put on and taking off regular upper body clothing?: A Lot Help from another person to put on and taking off regular lower body clothing?: A Lot 6 Click Score: 14   End of Session Equipment Utilized During Treatment: Gait belt Nurse Communication: Mobility status  Activity Tolerance: Patient tolerated treatment well Patient left: in chair;with call bell/phone within reach;with family/visitor present  OT Visit Diagnosis: Unsteadiness on feet (R26.81);Other abnormalities of gait and mobility (R26.89);Muscle weakness (generalized) (M62.81)                Time: 1043-1100 OT Time Calculation (min): 17 min Charges:  OT General Charges $OT Visit: 1 Visit OT Evaluation $OT Eval Low Complexity: 1 Low  Saqib Cazarez K, OTD, OTR/L SecureChat Preferred Acute Rehab [phone]   Melanie Long 09/25/2023, 12:24 PM

## 2023-09-25 NOTE — Progress Notes (Signed)
 Assessment & Plan: HD#2 - 73 y/o F w/ a hx of severe dementia who presented with abdominal pain  ?Acute cholecystitis  - WBC 17.1 this AM (normal on admission) - LFT's normal - no tenderness on exam - will get USN of RUQ - may need HIDA ?SBO  - no nausea or emesis, states she's hungry  - trial of clear liquids L Ovarian vs. Periovarian Mass  - pelvic USN shows hydrosalpinx - normal ovary   FEN - trial CLD VTE - Holding for possible procedure ID - Rocephin   Will follow.  Hopefully avoid operative intervention given prior abdominal procedures and multiple incisional herniae.        Krystal Spinner, MD [facility] A [facility] practice Office: [phone]        Chief Complaint: Abdominal pain  Subjective: Patient in bed, husband at bedside.  No nausea or emesis; wants to eat.  Objective: Vital signs in last 24 hours: Temp:  [97.3 F (36.3 C)-98.5 F (36.9 C)] 97.3 F (36.3 C) (02/09 0347) Pulse Rate:  [67-76] 76 (02/09 0347) Resp:  [15-17] 17 (02/09 0347) BP: (119-153)/(60-120) 119/68 (02/09 0347) SpO2:  [92 %-100 %] 92 % (02/09 0347) Weight:  [99.2 kg] 99.2 kg (02/09 0500) Last BM Date : 09/22/23  Intake/Output from previous day: 02/08 0701 - 02/09 0700 In: 412.6 [I.V.:312.6; IV Piggyback:100] Out: 700 [Urine:700] Intake/Output this shift: Total I/O In: 100 [IV Piggyback:100] Out: -   Physical Exam: HEENT - sclerae clear, mucous membranes moist Neck - soft Abdomen - soft without distension; multiple soft herniae; non-tender; no mass  Lab Results:  Recent Labs    09/23/23 2258 09/25/23 0513  WBC 8.8 17.1*  HGB 14.5 14.9  HCT 43.2 44.5  PLT 145* 126*   BMET Recent Labs    09/23/23 2258 09/25/23 0513  NA 138 138  K 4.0 3.6  CL 103 103  CO2 21* 24  GLUCOSE 132* 113*  BUN 25* 15  CREATININE 0.84 0.79  CALCIUM  8.9 8.4*   PT/INR No results for input(s): LABPROT, INR in the last 72 hours. Comprehensive Metabolic Panel:     Component Value Date/Time   NA 138 09/25/2023 0513   NA 138 09/23/2023 2258   K 3.6 09/25/2023 0513   K 4.0 09/23/2023 2258   CL 103 09/25/2023 0513   CL 103 09/23/2023 2258   CO2 24 09/25/2023 0513   CO2 21 (L) 09/23/2023 2258   BUN 15 09/25/2023 0513   BUN 25 (H) 09/23/2023 2258   CREATININE 0.79 09/25/2023 0513   CREATININE 0.84 09/23/2023 2258   GLUCOSE 113 (H) 09/25/2023 0513   GLUCOSE 132 (H) 09/23/2023 2258   CALCIUM  8.4 (L) 09/25/2023 0513   CALCIUM  8.9 09/23/2023 2258   AST 21 09/25/2023 0513   AST 18 09/23/2023 2258   ALT 12 09/25/2023 0513   ALT 14 09/23/2023 2258   ALKPHOS 50 09/25/2023 0513   ALKPHOS 53 09/23/2023 2258   BILITOT 0.6 09/25/2023 0513   BILITOT 0.6 09/23/2023 2258   PROT 6.2 (L) 09/25/2023 0513   PROT 6.7 09/23/2023 2258   ALBUMIN 2.9 (L) 09/25/2023 0513   ALBUMIN 3.4 (L) 09/23/2023 2258    Studies/Results: US  PELVIS (TRANSABDOMINAL ONLY) Result Date: 09/24/2023 CLINICAL DATA:  73 year old female with incidental left adnexal mass on CT. EXAM: TRANSABDOMINAL ULTRASOUND OF PELVIS TECHNIQUE: Transabdominal ultrasound examination of the pelvis was performed including evaluation of the uterus, ovaries, adnexal regions, and pelvic cul-de-sac. COMPARISON:  CT abdomen/pelvis from  earlier today FINDINGS: Uterus Measurements: 6.6 x 3.2 x 4.2 cm = volume: 46 mL. Anteverted uterus is normal in size and configuration with no uterine fibroids or other myometrial abnormalities. Endometrium Thickness: 5 mm. No endometrial cavity fluid or focal endometrial mass. Right ovary Measurements: 1.5 x 1.0 x 1.7 cm = volume: 1.3 mL. Normal appearance/no adnexal mass. Left ovary Measurements: 1.8 x 1.4 x 1.9 cm = volume: 2.5 mL. Tubular 3.9 x 2.2 x 5.9 cm left adnexal cystic structure with incomplete eccentric thin internal septations, most compatible with moderate left hydrosalpinx. Separate normal left ovary. Other findings:  No abnormal free fluid. IMPRESSION: 1. Moderate left  hydrosalpinx. 2. Otherwise normal pelvic ultrasound. Electronically Signed   By: Selinda DELENA Blue M.D.   On: 09/24/2023 17:01   CT ABDOMEN PELVIS W CONTRAST Result Date: 09/24/2023 CLINICAL DATA:  Abdominal pain. EXAM: CT ABDOMEN AND PELVIS WITH CONTRAST TECHNIQUE: Multidetector CT imaging of the abdomen and pelvis was performed using the standard protocol following bolus administration of intravenous contrast. RADIATION DOSE REDUCTION: This exam was performed according to the departmental dose-optimization program which includes automated exposure control, adjustment of the mA and/or kV according to patient size and/or use of iterative reconstruction technique. CONTRAST:  75mL OMNIPAQUE  IOHEXOL  350 MG/ML SOLN COMPARISON:  None Available. FINDINGS: Lower chest: Lung bases show posterior atelectasis without infiltrates. Mild elevation of the right diaphragm. The cardiac size is normal. There are scattered single-vessel calcifications in the LAD coronary artery. Minimal pericardial effusion. Hepatobiliary: The liver enhances homogeneously. No mass is seen. No biliary dilatation. The gallbladder is dilated measuring 13 cm in length with wall thickening, pericholecystic fluid and stones. Findings most likely due to acute cholecystitis. There is a 2.8 cm stone in the gallbladder neck which may well be impacted. There are a few more stones more distally. The largest fundal stone is 4 cm. Pancreas: Partially atrophic.  Focal abnormality. Spleen: No mass.  No splenomegaly. Adrenals/Urinary Tract: Adrenal glands are unremarkable. Kidneys are normal, without renal calculi, focal lesion, or hydronephrosis. Bladder is unremarkable. Stomach/Bowel: Small hiatal hernia. Unremarkable stomach. In the abdomen and pelvis proper, the small bowel is normal in caliber. There is a wide mouth supraumbilical midline hernia through an 8 cm wide wall defect containing a portion of the mid transverse colon. Beginning just below this hernia,  there is another wide mouth hernia through a 7 cm wide defect, containing a portion of the proximal to mid transverse colon and a loop of the small bowel which is normal caliber. This hernia sac ends just above the umbilicus. At and below the umbilicus, there is wide rectus diastasis as much as 11 cm in diameter and a large complex ventral hernia with the sac extending down as far as the level of the pubic bone, containing the cecum and proximal ascending colon and multiple small bowel segments some of which are mildly dilated up to 2.6 cm. This could indicate ileus or low-grade small bowel obstruction within the hernia although a discrete transition was not convincingly seen. There is a fourth hernia of the anterolateral left lower abdominal wall through a 4 cm wide defect containing a portion of the distal descending/proximal sigmoid colon, and appears to be nonobstructing. The appendix is surgically absent. No thickening is seen in the bowel wall. No pneumatosis. Vascular/Lymphatic: Aortic atherosclerosis. No enlarged abdominal or pelvic lymph nodes. Reproductive: The uterus is intact. The right ovary is unremarkable. There is a left ovarian versus paraovarian mass measuring 5.0 x 3.2 x 4  cm, homogeneously low in attenuation measuring 27 Hounsfield units. Pelvic ultrasound is recommended. Multiple pelvic phleboliths. Other: No free fluid, free hemorrhage or free air. No edema is seen in the above hernia sacs. Musculoskeletal: There is extensive bridging enthesopathy of the thoracic spine, degenerative changes and spondylosis lumbar spine with advanced facet hypertrophy in the lumbar region from L3 down. Unilateral left pars defect appears chronic at L5 with slight grade 1 L5-S1 spondylolisthesis. No acute or other significant osseous findings. The SI joints are patent. IMPRESSION: 1. Findings consistent with acute cholecystitis, with a 2.8 cm stone in the gallbladder neck which may be impacted. Additional stones  up to 4 cm more distally. 2. Aortic and single-vessel coronary artery atherosclerosis. 3. Small hiatal hernia. 4. Four abdominal wall hernias, 2 above the umbilicus, one below the umbilicus, and one in the anterolateral left lower abdominal wall. 5. The 2 most cephalad above hernias contain portions of the transverse colon and small bowel, and the largest ventral hernia contains the cecum and proximal ascending colon and multiple small bowel segments some of which are mildly dilated up to 2.6 cm. This could indicate ileus or low-grade small bowel obstruction within the hernia although a discrete transition was not convincingly seen. 6. 5.0 x 3.2 x 4 cm left ovarian versus paraovarian mass. Pelvic ultrasound is recommended. 7. Chronic left L5 pars defect with slight grade 1 L5-S1 spondylolisthesis. 8. These results will be called to the ordering clinician or representative by the Radiologist Assistant, and communication documented in the PACS or [employer]. Aortic Atherosclerosis (ICD10-I70.0). Electronically Signed   By: Francis Quam M.D.   On: 09/24/2023 04:20   DG Chest Port 1 View Result Date: 09/23/2023 CLINICAL DATA:  Altered mental status and body aches. EXAM: PORTABLE CHEST 1 VIEW COMPARISON:  None Available. FINDINGS: The heart size and mediastinal contours are within normal limits. There is scattered aortic calcific plaque. Both lungs are clear with slightly elevated right diaphragm. The visualized skeletal structures are intact, with thoracic spondylosis. IMPRESSION: No evidence of acute chest disease. Aortic atherosclerosis. Electronically Signed   By: Francis Quam M.D.   On: 09/23/2023 23:05      Krystal Spinner 09/25/2023  Patient ID: Melanie Long, female   DOB: Aug 26, 1950, 73 y.o.   MRN: [id number]

## 2023-09-25 NOTE — Progress Notes (Signed)
 PROGRESS NOTE  Melanie Long FMW:[id number] DOB: 05-06-1951 DOA: 09/23/2023 PCP: Gretta Comer POUR, NP   LOS: 1 day   Brief narrative:  Melanie Long  is a 73 y.o. 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  She was having bowel movements and passing gas.  In the ED, patient had normal WBC.  Was afebrile.  LFTs were within normal limits.  Imaging showed acute cholecystitis with cholelithiasis, multiple ventral hernias c and bowel with mild small bowel dilation, as well left ovarian versus periovarian mass, patient was then considered for admission to hospital for further evaluation and treatment.   Assessment/Plan: Principal Problem:   Acute cholecystitis Active Problems:   Postoperative hypothyroidism   Essential hypertension   Dementia (HCC)   Mixed hyperlipidemia   Acute cholecystitis Cholelithiasis General surgery was consulted.  No leukocytosis or fever.  LFTs within normal limits.  Nontender in the right upper quadrant.  Continue IV Rocephin  and Flagyl .  Keep NPO.  Follow-up surgical recommendation.  Continue IV fluids.  General surgery has plans for right upper quadrant ultrasound.  May need HIDA scan.  Questionable small bowel obstruction on imaging No clinical signs.  Currently NPO.  Will continue to monitor.  Plan for right upper quadrant ultrasound.   Left ovarian versus paraovarian mass -Colonoscopy 2022 with no acute findings.  Ultrasound of the pelvis showed a left moderate hydrosalpinx otherwise normal pelvic ultrasound.   Hypertension Continue benazepril .  Continue as needed hydralazine .   History of seizures Continue Depakote    Advanced dementia Continue supportive care.  Continue  Namenda .   Hyperlipidemia -Resume statin when stable   Hypo-thyroidism Continue Synthroid    DVT prophylaxis: heparin  injection 5,000 Units Start: 09/25/23 1400 SCDs Start: 09/24/23 0535   Disposition: Uncertain at this time.  Status is: Inpatient Remains inpatient appropriate because: Transferred cholecystitis with small bowel obstruction.    Code Status:     Code Status: Full Code  Family Communication: Spoke with the patient's daughter and family at bedside.  Consultants: General surgery  Procedures: None  Anti-infectives:  Rocephin  and Flagyl   Anti-infectives (From admission, onward)    Start     Dose/Rate Route Frequency Ordered Stop   09/25/23 0500  cefTRIAXone  (ROCEPHIN ) 2 g in sodium chloride  0.9 % 100 mL IVPB        2 g 200 mL/hr over 30 Minutes Intravenous Every 24 hours 09/24/23 0803     09/24/23 0815  metroNIDAZOLE  (FLAGYL ) IVPB 500 mg        500 mg 100 mL/hr over 60 Minutes Intravenous Every 12 hours 09/24/23 0803     09/24/23 0445  cefTRIAXone  (ROCEPHIN ) 2 g in sodium chloride  0.9 % 100 mL IVPB        2 g 200 mL/hr over 30 Minutes Intravenous  Once 09/24/23 0436 09/24/23 0519        Subjective: Today, patient was seen and examined at bedside.  Poor historian due to advanced dementia.  Moans in pain when pressed on the abdomen.  Family at bedside.  Objective: Vitals:   09/25/23 0347 09/25/23 0847  BP: 119/68 116/69  Pulse: 76 66  Resp: 17 17  Temp: (!) 97.3 F (36.3 C) 99.4 F (37.4 C)  SpO2: 92% 96%    Intake/Output Summary (Last 24 hours) at 09/25/2023 9071 Last data  filed at 09/25/2023 0900 Gross per 24 hour  Intake 512.58 ml  Output 700 ml  Net -187.42 ml   Filed Weights   09/23/23 2215 09/25/23 0500  Weight: 106.1 kg 99.2 kg   Body mass index is 34.25 kg/m.   Physical Exam:  GENERAL: Patient is alert awake but disoriented, advanced dementia, not in obvious distress. HENT: No scleral pallor or icterus. Pupils equally  reactive to light. Oral mucosa is moist NECK: is supple, no gross swelling noted. CHEST: Clear to auscultation. No crackles or wheezes.  Diminished breath sounds bilaterally. CVS: S1 and S2 heard, no murmur. Regular rate and rhythm.  ABDOMEN: Soft, nonspecific tenderness palpation of the right lower quadrant.  Bowel sounds are present. EXTREMITIES: No edema. CNS: Cranial nerves are intact. No focal motor deficits. SKIN: warm and dry without rashes.  Data Review: I have personally reviewed the following laboratory data and studies,  CBC: Recent Labs  Lab 09/23/23 2258 09/25/23 0513  WBC 8.8 17.1*  NEUTROABS 5.8  --   HGB 14.5 14.9  HCT 43.2 44.5  MCV 92.1 91.0  PLT 145* 126*   Basic Metabolic Panel: Recent Labs  Lab 09/23/23 2258 09/25/23 0513  NA 138 138  K 4.0 3.6  CL 103 103  CO2 21* 24  GLUCOSE 132* 113*  BUN 25* 15  CREATININE 0.84 0.79  CALCIUM  8.9 8.4*   Liver Function Tests: Recent Labs  Lab 09/23/23 2258 09/25/23 0513  AST 18 21  ALT 14 12  ALKPHOS 53 50  BILITOT 0.6 0.6  PROT 6.7 6.2*  ALBUMIN 3.4* 2.9*   Recent Labs  Lab 09/23/23 2258  LIPASE 29   No results for input(s): AMMONIA in the last 168 hours. Cardiac Enzymes: Recent Labs  Lab 09/23/23 2258  CKTOTAL 47   BNP (last 3 results) No results for input(s): BNP in the last 8760 hours.  ProBNP (last 3 results) No results for input(s): PROBNP in the last 8760 hours.  CBG: No results for input(s): GLUCAP in the last 168 hours. Recent Results (from the past 240 hours)  Resp panel by RT-PCR (RSV, Flu A&B, Covid) Anterior Nasal Swab     Status: None   Collection Time: 09/23/23 11:04 PM   Specimen: Anterior Nasal Swab  Result Value Ref Range Status   SARS Coronavirus 2 by RT PCR NEGATIVE NEGATIVE Final   Influenza A by PCR NEGATIVE NEGATIVE Final   Influenza B by PCR NEGATIVE NEGATIVE Final    Comment: (NOTE) The Xpert Xpress SARS-CoV-2/FLU/RSV plus assay is intended as an  aid in the diagnosis of influenza from Nasopharyngeal swab specimens and should not be used as a sole basis for treatment. Nasal washings and aspirates are unacceptable for Xpert Xpress SARS-CoV-2/FLU/RSV testing.  Fact Sheet for Patients: bloggercourse.com  Fact Sheet for Healthcare Providers: seriousbroker.it  This test is not yet approved or cleared by the [place]  FDA and has been authorized for detection and/or diagnosis of SARS-CoV-2 by FDA under an Emergency Use Authorization (EUA). This EUA will remain in effect (meaning this test can be used) for the duration of the COVID-19 declaration under Section 564(b)(1) of the Act, 21 U.S.C. section 360bbb-3(b)(1), unless the authorization is terminated or revoked.     Resp Syncytial Virus by PCR NEGATIVE NEGATIVE Final    Comment: (NOTE) Fact Sheet for Patients: bloggercourse.com  Fact Sheet for Healthcare Providers: seriousbroker.it  This test is not yet approved or cleared by the [place]  FDA and has been authorized  for detection and/or diagnosis of SARS-CoV-2 by FDA under an Emergency Use Authorization (EUA). This EUA will remain in effect (meaning this test can be used) for the duration of the COVID-19 declaration under Section 564(b)(1) of the Act, 21 U.S.C. section 360bbb-3(b)(1), unless the authorization is terminated or revoked.  Performed at [facility], [address] [place], [place] [address]   Respiratory (~20 pathogens) panel by PCR     Status: None   Collection Time: 09/23/23 11:04 PM   Specimen: Nasopharyngeal Swab; Respiratory  Result Value Ref Range Status   Adenovirus NOT DETECTED NOT DETECTED Final   Coronavirus 229E NOT DETECTED NOT DETECTED Final    Comment: (NOTE) The Coronavirus on the Respiratory Panel, DOES NOT test for the novel  Coronavirus (2019 nCoV)    Coronavirus HKU1 NOT  DETECTED NOT DETECTED Final   Coronavirus NL63 NOT DETECTED NOT DETECTED Final   Coronavirus OC43 NOT DETECTED NOT DETECTED Final   Metapneumovirus NOT DETECTED NOT DETECTED Final   Rhinovirus / Enterovirus NOT DETECTED NOT DETECTED Final   Influenza A NOT DETECTED NOT DETECTED Final   Influenza B NOT DETECTED NOT DETECTED Final   Parainfluenza Virus 1 NOT DETECTED NOT DETECTED Final   Parainfluenza Virus 2 NOT DETECTED NOT DETECTED Final   Parainfluenza Virus 3 NOT DETECTED NOT DETECTED Final   Parainfluenza Virus 4 NOT DETECTED NOT DETECTED Final   Respiratory Syncytial Virus NOT DETECTED NOT DETECTED Final   Bordetella pertussis NOT DETECTED NOT DETECTED Final   Bordetella Parapertussis NOT DETECTED NOT DETECTED Final   Chlamydophila pneumoniae NOT DETECTED NOT DETECTED Final   Mycoplasma pneumoniae NOT DETECTED NOT DETECTED Final    Comment: Performed at [facility], [address], [place], [place] [address]     Studies: US  PELVIS (TRANSABDOMINAL ONLY) Result Date: 09/24/2023 CLINICAL DATA:  73 year old female with incidental left adnexal mass on CT. EXAM: TRANSABDOMINAL ULTRASOUND OF PELVIS TECHNIQUE: Transabdominal ultrasound examination of the pelvis was performed including evaluation of the uterus, ovaries, adnexal regions, and pelvic cul-de-sac. COMPARISON:  CT abdomen/pelvis from earlier today FINDINGS: Uterus Measurements: 6.6 x 3.2 x 4.2 cm = volume: 46 mL. Anteverted uterus is normal in size and configuration with no uterine fibroids or other myometrial abnormalities. Endometrium Thickness: 5 mm. No endometrial cavity fluid or focal endometrial mass. Right ovary Measurements: 1.5 x 1.0 x 1.7 cm = volume: 1.3 mL. Normal appearance/no adnexal mass. Left ovary Measurements: 1.8 x 1.4 x 1.9 cm = volume: 2.5 mL. Tubular 3.9 x 2.2 x 5.9 cm left adnexal cystic structure with incomplete eccentric thin internal septations, most compatible with moderate left hydrosalpinx. Separate  normal left ovary. Other findings:  No abnormal free fluid. IMPRESSION: 1. Moderate left hydrosalpinx. 2. Otherwise normal pelvic ultrasound. Electronically Signed   By: Selinda DELENA Blue M.D.   On: 09/24/2023 17:01   CT ABDOMEN PELVIS W CONTRAST Result Date: 09/24/2023 CLINICAL DATA:  Abdominal pain. EXAM: CT ABDOMEN AND PELVIS WITH CONTRAST TECHNIQUE: Multidetector CT imaging of the abdomen and pelvis was performed using the standard protocol following bolus administration of intravenous contrast. RADIATION DOSE REDUCTION: This exam was performed according to the departmental dose-optimization program which includes automated exposure control, adjustment of the mA and/or kV according to patient size and/or use of iterative reconstruction technique. CONTRAST:  75mL OMNIPAQUE  IOHEXOL  350 MG/ML SOLN COMPARISON:  None Available. FINDINGS: Lower chest: Lung bases show posterior atelectasis without infiltrates. Mild elevation of the right diaphragm. The cardiac size is normal. There  are scattered single-vessel calcifications in the LAD coronary artery. Minimal pericardial effusion. Hepatobiliary: The liver enhances homogeneously. No mass is seen. No biliary dilatation. The gallbladder is dilated measuring 13 cm in length with wall thickening, pericholecystic fluid and stones. Findings most likely due to acute cholecystitis. There is a 2.8 cm stone in the gallbladder neck which may well be impacted. There are a few more stones more distally. The largest fundal stone is 4 cm. Pancreas: Partially atrophic.  Focal abnormality. Spleen: No mass.  No splenomegaly. Adrenals/Urinary Tract: Adrenal glands are unremarkable. Kidneys are normal, without renal calculi, focal lesion, or hydronephrosis. Bladder is unremarkable. Stomach/Bowel: Small hiatal hernia. Unremarkable stomach. In the abdomen and pelvis proper, the small bowel is normal in caliber. There is a wide mouth supraumbilical midline hernia through an 8 cm wide wall defect  containing a portion of the mid transverse colon. Beginning just below this hernia, there is another wide mouth hernia through a 7 cm wide defect, containing a portion of the proximal to mid transverse colon and a loop of the small bowel which is normal caliber. This hernia sac ends just above the umbilicus. At and below the umbilicus, there is wide rectus diastasis as much as 11 cm in diameter and a large complex ventral hernia with the sac extending down as far as the level of the pubic bone, containing the cecum and proximal ascending colon and multiple small bowel segments some of which are mildly dilated up to 2.6 cm. This could indicate ileus or low-grade small bowel obstruction within the hernia although a discrete transition was not convincingly seen. There is a fourth hernia of the anterolateral left lower abdominal wall through a 4 cm wide defect containing a portion of the distal descending/proximal sigmoid colon, and appears to be nonobstructing. The appendix is surgically absent. No thickening is seen in the bowel wall. No pneumatosis. Vascular/Lymphatic: Aortic atherosclerosis. No enlarged abdominal or pelvic lymph nodes. Reproductive: The uterus is intact. The right ovary is unremarkable. There is a left ovarian versus paraovarian mass measuring 5.0 x 3.2 x 4 cm, homogeneously low in attenuation measuring 27 Hounsfield units. Pelvic ultrasound is recommended. Multiple pelvic phleboliths. Other: No free fluid, free hemorrhage or free air. No edema is seen in the above hernia sacs. Musculoskeletal: There is extensive bridging enthesopathy of the thoracic spine, degenerative changes and spondylosis lumbar spine with advanced facet hypertrophy in the lumbar region from L3 down. Unilateral left pars defect appears chronic at L5 with slight grade 1 L5-S1 spondylolisthesis. No acute or other significant osseous findings. The SI joints are patent. IMPRESSION: 1. Findings consistent with acute cholecystitis,  with a 2.8 cm stone in the gallbladder neck which may be impacted. Additional stones up to 4 cm more distally. 2. Aortic and single-vessel coronary artery atherosclerosis. 3. Small hiatal hernia. 4. Four abdominal wall hernias, 2 above the umbilicus, one below the umbilicus, and one in the anterolateral left lower abdominal wall. 5. The 2 most cephalad above hernias contain portions of the transverse colon and small bowel, and the largest ventral hernia contains the cecum and proximal ascending colon and multiple small bowel segments some of which are mildly dilated up to 2.6 cm. This could indicate ileus or low-grade small bowel obstruction within the hernia although a discrete transition was not convincingly seen. 6. 5.0 x 3.2 x 4 cm left ovarian versus paraovarian mass. Pelvic ultrasound is recommended. 7. Chronic left L5 pars defect with slight grade 1 L5-S1 spondylolisthesis. 8. These results  will be called to the ordering clinician or representative by the Radiologist Assistant, and communication documented in the PACS or [employer]. Aortic Atherosclerosis (ICD10-I70.0). Electronically Signed   By: Francis Quam M.D.   On: 09/24/2023 04:20   DG Chest Port 1 View Result Date: 09/23/2023 CLINICAL DATA:  Altered mental status and body aches. EXAM: PORTABLE CHEST 1 VIEW COMPARISON:  None Available. FINDINGS: The heart size and mediastinal contours are within normal limits. There is scattered aortic calcific plaque. Both lungs are clear with slightly elevated right diaphragm. The visualized skeletal structures are intact, with thoracic spondylosis. IMPRESSION: No evidence of acute chest disease. Aortic atherosclerosis. Electronically Signed   By: Francis Quam M.D.   On: 09/23/2023 23:05      Vernal Alstrom, MD  [facility] 09/25/2023  If 7PM-7AM, please contact night-coverage

## 2023-09-26 DIAGNOSIS — K810 Acute cholecystitis: Secondary | ICD-10-CM | POA: Diagnosis not present

## 2023-09-26 LAB — COMPREHENSIVE METABOLIC PANEL
ALT: 12 U/L (ref 0–44)
AST: 18 U/L (ref 15–41)
Albumin: 2.4 g/dL — ABNORMAL LOW (ref 3.5–5.0)
Alkaline Phosphatase: 46 U/L (ref 38–126)
Anion gap: 9 (ref 5–15)
BUN: 18 mg/dL (ref 8–23)
CO2: 26 mmol/L (ref 22–32)
Calcium: 7.9 mg/dL — ABNORMAL LOW (ref 8.9–10.3)
Chloride: 102 mmol/L (ref 98–111)
Creatinine, Ser: 0.72 mg/dL (ref 0.44–1.00)
GFR, Estimated: >60 mL/min (ref >60)
Glucose, Bld: 93 mg/dL (ref 70–99)
Potassium: 3.1 mmol/L — ABNORMAL LOW (ref 3.5–5.1)
Sodium: 137 mmol/L (ref 135–145)
Total Bilirubin: 0.5 mg/dL (ref 0.0–1.2)
Total Protein: 5.8 g/dL — ABNORMAL LOW (ref 6.5–8.1)

## 2023-09-26 LAB — URINE CULTURE: Culture: 30000 — AB

## 2023-09-26 LAB — CBC
HCT: 39.2 % (ref 36.0–46.0)
Hemoglobin: 13.5 g/dL (ref 12.0–15.0)
MCH: 31.3 pg (ref 26.0–34.0)
MCHC: 34.4 g/dL (ref 30.0–36.0)
MCV: 90.7 fL (ref 80.0–100.0)
Platelets: 120 10*3/uL — ABNORMAL LOW (ref 150–400)
RBC: 4.32 MIL/uL (ref 3.87–5.11)
RDW: 13.3 % (ref 11.5–15.5)
WBC: 12.2 10*3/uL — ABNORMAL HIGH (ref 4.0–10.5)
nRBC: 0 % (ref 0.0–0.2)

## 2023-09-26 LAB — MAGNESIUM: Magnesium: 1.9 mg/dL (ref 1.7–2.4)

## 2023-09-26 MED ORDER — POTASSIUM CHLORIDE 10 MEQ/100ML IV SOLN
10.0000 meq | INTRAVENOUS | Status: AC
Start: 2023-09-26 — End: 2023-09-26
  Administered 2023-09-26 (×4): 10 meq via INTRAVENOUS
  Filled 2023-09-26 (×4): qty 100

## 2023-09-26 NOTE — Progress Notes (Signed)
 Pt family requesting to see MD. Pokhrel,MD made aware

## 2023-09-26 NOTE — Progress Notes (Signed)
 PROGRESS NOTE  Melanie Long ZOX:[id number] DOB: 12/04/1950 DOA: 09/23/2023 PCP: Gabriel John, NP   LOS: 2 days   Brief narrative:  Melanie Long  is a 73 y.o. 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  She was having bowel movements and passing gas.  In the ED, patient had normal WBC.  Was afebrile.  LFTs were within normal limits.  Imaging showed acute cholecystitis with cholelithiasis, multiple ventral hernias c and bowel with mild small bowel dilation, as well left ovarian versus periovarian mass, patient was then considered for admission to hospital for further evaluation and treatment.   Assessment/Plan: Principal Problem:   Acute cholecystitis Active Problems:   Postoperative hypothyroidism   Essential hypertension   Dementia (HCC)   Mixed hyperlipidemia   Acute cholecystitis Cholelithiasis Questionable small bowel obstruction on imaging Surgery on board.  No leukocytosis or fever.  LFTs within normal limits.  Continue IV Rocephin  and Flagyl .  Has right upper quadrant tenderness outpatient today.Aaron Aas  Ultrasound with cholelithiasis and cholecystitis.  At this time family wishes to discuss about oral intake.  General surgery at bedside as well.  Plan is to continue IV antibiotic and try to avoid intervention if possible.  Has been advanced to full liquids.  If patient deteriorating we will  discuss about percutaneous cholecystostomy tube.  Not a good surgical candidate due to multiple hernias, advanced hernia..   Left ovarian versus paraovarian mass -Colonoscopy 2022 with no acute findings.  Ultrasound of the pelvis showed a left moderate hydrosalpinx otherwise normal pelvic ultrasound.    Hypertension Continue benazepril .  Continue as needed hydralazine .   History of seizures Continue Depakote    Advanced dementia with agitation, confusion. Will continue with the delirium precautions.  Continue supportive care.  Continue Namenda .  Likely to be hospital induced delirium.   Hyperlipidemia Tightness when stable.   Hypo-thyroidism Continue Synthroid    Goals of care.  Briefly discussed with the patient's family at bedside regarding goals of care if patient were continued to decline.  At this time family hopeful about improvement with IV antibiotic.  DVT prophylaxis: heparin  injection 5,000 Units Start: 09/25/23 1400 SCDs Start: 09/24/23 0535   Disposition: Uncertain at this time.  Status is: Inpatient Remains inpatient appropriate because: Pending clinical improvement, IV antibiotic,  Code Status:     Code Status: Full Code  Family Communication: Spoke with the patient's daughter and family at bedside again today in detail regarding goals of care and potential outcomes in this situation.  Family is hopeful about the improvement with antibiotic.  Consultants: General surgery  Procedures: None  Anti-infectives:  Rocephin  and Flagyl   Anti-infectives (From admission, onward)    Start     Dose/Rate Route Frequency Ordered Stop   09/25/23 0500  cefTRIAXone  (ROCEPHIN ) 2 g in sodium chloride  0.9 % 100 mL IVPB        2 g 200 mL/hr over 30 Minutes Intravenous Every 24 hours 09/24/23 0803     09/24/23 0815  metroNIDAZOLE  (FLAGYL ) IVPB 500 mg        500 mg 100 mL/hr over 60 Minutes Intravenous Every 12 hours 09/24/23 0803     09/24/23 0445  cefTRIAXone  (ROCEPHIN ) 2 g in sodium chloride  0.9 % 100 mL IVPB        2 g 200 mL/hr over  30 Minutes Intravenous  Once 09/24/23 0436 09/24/23 0519      Subjective: Today, patient was seen and examined at bedside.  Poor historian due to advanced dementia.  Mildly Communicative and complains of tenderness on palpation of the  abdomen, family at bedside.  Family reported that she was very agitated confused and did not rest well.  Family inquiring about oral intake.  Objective: Vitals:   09/26/23 0457 09/26/23 0752  BP: (!) 94/57 (!) 97/57  Pulse: 87 80  Resp: 18 16  Temp: 97.8 F (36.6 C) (!) 97.5 F (36.4 C)  SpO2: 94% 94%    Intake/Output Summary (Last 24 hours) at 09/26/2023 1300 Last data filed at 09/25/2023 1700 Gross per 24 hour  Intake 424.89 ml  Output --  Net 424.89 ml   Filed Weights   09/23/23 2215 09/25/23 0500 09/26/23 0500  Weight: 106.1 kg 99.2 kg 99.1 kg   Body mass index is 34.22 kg/m.   Physical Exam:  GENERAL: Patient is alert awake but disoriented, advanced dementia episodes of agitation and confusion, mildly Communicative, HENT: No scleral pallor or icterus. Pupils equally reactive to light. Oral mucosa is moist NECK: is supple, no gross swelling noted. CHEST: No crackles or wheezes.  Diminished breath sounds bilaterally. CVS: S1 and S2 heard, no murmur. Regular rate and rhythm.  ABDOMEN: Soft, tenderness over the right upper quadrant, bowel sounds are present. EXTREMITIES: No edema. CNS: Cranial nerves are intact.  With all extremities, has advanced dementia, SKIN: warm and dry without rashes.  Data Review: I have personally reviewed the following laboratory data and studies,  CBC: Recent Labs  Lab 09/23/23 2258 09/25/23 0513 09/26/23 0603  WBC 8.8 17.1* 12.2*  NEUTROABS 5.8  --   --   HGB 14.5 14.9 13.5  HCT 43.2 44.5 39.2  MCV 92.1 91.0 90.7  PLT 145* 126* 120*   Basic Metabolic Panel: Recent Labs  Lab 09/23/23 2258 09/25/23 0513 09/26/23 0603  NA 138 138 137  K 4.0 3.6 3.1*  CL 103 103 102  CO2 21* 24 26  GLUCOSE 132* 113* 93  BUN 25* 15 18  CREATININE 0.84 0.79 0.72  CALCIUM  8.9 8.4* 7.9*  MG  --   --  1.9   Liver Function Tests: Recent Labs  Lab 09/23/23 2258 09/25/23 0513 09/26/23 0603  AST 18 21 18   ALT 14 12 12   ALKPHOS 53 50 46   BILITOT 0.6 0.6 0.5  PROT 6.7 6.2* 5.8*  ALBUMIN 3.4* 2.9* 2.4*   Recent Labs  Lab 09/23/23 2258  LIPASE 29   No results for input(s): "AMMONIA" in the last 168 hours. Cardiac Enzymes: Recent Labs  Lab 09/23/23 2258  CKTOTAL 47   BNP (last 3 results) No results for input(s): "BNP" in the last 8760 hours.  ProBNP (last 3 results) No results for input(s): "PROBNP" in the last 8760 hours.  CBG: No results for input(s): "GLUCAP" in the last 168 hours. Recent Results (from the past 240 hours)  Resp panel by RT-PCR (RSV, Flu A&B, Covid) Anterior Nasal Swab     Status: None   Collection Time: 09/23/23 11:04 PM   Specimen: Anterior Nasal Swab  Result Value Ref Range Status   SARS Coronavirus 2 by RT PCR NEGATIVE NEGATIVE Final   Influenza A by PCR NEGATIVE NEGATIVE Final   Influenza B by PCR NEGATIVE NEGATIVE Final    Comment: (NOTE) The Xpert Xpress SARS-CoV-2/FLU/RSV plus assay is intended as an aid in the diagnosis  of influenza from Nasopharyngeal swab specimens and should not be used as a sole basis for treatment. Nasal washings and aspirates are unacceptable for Xpert Xpress SARS-CoV-2/FLU/RSV testing.  Fact Sheet for Patients: BloggerCourse.com  Fact Sheet for Healthcare Providers: SeriousBroker.it  This test is not yet approved or cleared by the [place]  FDA and has been authorized for detection and/or diagnosis of SARS-CoV-2 by FDA under an Emergency Use Authorization (EUA). This EUA will remain in effect (meaning this test can be used) for the duration of the COVID-19 declaration under Section 564(b)(1) of the Act, 21 U.S.C. section 360bbb-3(b)(1), unless the authorization is terminated or revoked.     Resp Syncytial Virus by PCR NEGATIVE NEGATIVE Final    Comment: (NOTE) Fact Sheet for Patients: BloggerCourse.com  Fact Sheet for Healthcare  Providers: SeriousBroker.it  This test is not yet approved or cleared by the [place]  FDA and has been authorized for detection and/or diagnosis of SARS-CoV-2 by FDA under an Emergency Use Authorization (EUA). This EUA will remain in effect (meaning this test can be used) for the duration of the COVID-19 declaration under Section 564(b)(1) of the Act, 21 U.S.C. section 360bbb-3(b)(1), unless the authorization is terminated or revoked.  Performed at [facility], [address] [place], [place] [address]   Respiratory (~20 pathogens) panel by PCR     Status: None   Collection Time: 09/23/23 11:04 PM   Specimen: Nasopharyngeal Swab; Respiratory  Result Value Ref Range Status   Adenovirus NOT DETECTED NOT DETECTED Final   Coronavirus 229E NOT DETECTED NOT DETECTED Final    Comment: (NOTE) The Coronavirus on the Respiratory Panel, DOES NOT test for the novel  Coronavirus (2019 nCoV)    Coronavirus HKU1 NOT DETECTED NOT DETECTED Final   Coronavirus NL63 NOT DETECTED NOT DETECTED Final   Coronavirus OC43 NOT DETECTED NOT DETECTED Final   Metapneumovirus NOT DETECTED NOT DETECTED Final   Rhinovirus / Enterovirus NOT DETECTED NOT DETECTED Final   Influenza A NOT DETECTED NOT DETECTED Final   Influenza B NOT DETECTED NOT DETECTED Final   Parainfluenza Virus 1 NOT DETECTED NOT DETECTED Final   Parainfluenza Virus 2 NOT DETECTED NOT DETECTED Final   Parainfluenza Virus 3 NOT DETECTED NOT DETECTED Final   Parainfluenza Virus 4 NOT DETECTED NOT DETECTED Final   Respiratory Syncytial Virus NOT DETECTED NOT DETECTED Final   Bordetella pertussis NOT DETECTED NOT DETECTED Final   Bordetella Parapertussis NOT DETECTED NOT DETECTED Final   Chlamydophila pneumoniae NOT DETECTED NOT DETECTED Final   Mycoplasma pneumoniae NOT DETECTED NOT DETECTED Final    Comment: Performed at [facility], [address] [place], [place] [address]  Urine Culture      Status: Abnormal   Collection Time: 09/24/23 12:44 AM   Specimen: Urine, Clean Catch  Result Value Ref Range Status   Specimen Description URINE, CLEAN CATCH  Final   Special Requests NONE  Final   Culture (A)  Final    30,000 COLONIES/mL STAPHYLOCOCCUS HAEMOLYTICUS 70,000 COLONIES/mL DIPHTHEROIDS(CORYNEBACTERIUM SPECIES) Standardized susceptibility testing for this organism is not available. Performed at [facility], [address] [place], [place] [address]    Report Status 09/26/2023 FINAL  Final   Organism ID, Bacteria STAPHYLOCOCCUS HAEMOLYTICUS (A)  Final      Susceptibility   Staphylococcus haemolyticus - MIC*    CIPROFLOXACIN <=0.5 SENSITIVE Sensitive     GENTAMICIN <=0.5 SENSITIVE Sensitive     NITROFURANTOIN <=16 SENSITIVE Sensitive  OXACILLIN <=0.25 SENSITIVE Sensitive     TETRACYCLINE <=1 SENSITIVE Sensitive     VANCOMYCIN <=0.5 SENSITIVE Sensitive     TRIMETH/SULFA <=10 SENSITIVE Sensitive     RIFAMPIN <=0.5 SENSITIVE Sensitive     Inducible Clindamycin NEGATIVE Sensitive     * 30,000 COLONIES/mL STAPHYLOCOCCUS HAEMOLYTICUS     Studies: US  Abdomen Limited RUQ (LIVER/GB) Result Date: 09/25/2023 CLINICAL DATA:  Abdominal pain. EXAM: ULTRASOUND ABDOMEN LIMITED RIGHT UPPER QUADRANT COMPARISON:  CT abdomen pelvis dated 09/24/2023. FINDINGS: Evaluation is limited due to body habitus and overlying bowel gas. Gallbladder: There is a 1.6 cm gallstone. The gallbladder wall appears thickened and edematous measuring 5 mm. No significant pericholecystic fluid. Positive sonographic Murphy's sign reported. Common bile duct: Diameter: Not visualized. Liver: The liver is suboptimally visualized and poorly evaluated. Other: None. IMPRESSION: Cholelithiasis with findings of acute cholecystitis. Electronically Signed   By: Angus Bark M.D.   On: 09/25/2023 19:21   US  PELVIS (TRANSABDOMINAL ONLY) Result Date: 09/24/2023 CLINICAL DATA:  73 year old female with incidental left  adnexal mass on CT. EXAM: TRANSABDOMINAL ULTRASOUND OF PELVIS TECHNIQUE: Transabdominal ultrasound examination of the pelvis was performed including evaluation of the uterus, ovaries, adnexal regions, and pelvic cul-de-sac. COMPARISON:  CT abdomen/pelvis from earlier today FINDINGS: Uterus Measurements: 6.6 x 3.2 x 4.2 cm = volume: 46 mL. Anteverted uterus is normal in size and configuration with no uterine fibroids or other myometrial abnormalities. Endometrium Thickness: 5 mm. No endometrial cavity fluid or focal endometrial mass. Right ovary Measurements: 1.5 x 1.0 x 1.7 cm = volume: 1.3 mL. Normal appearance/no adnexal mass. Left ovary Measurements: 1.8 x 1.4 x 1.9 cm = volume: 2.5 mL. Tubular 3.9 x 2.2 x 5.9 cm left adnexal cystic structure with incomplete eccentric thin internal septations, most compatible with moderate left hydrosalpinx. Separate normal left ovary. Other findings:  No abnormal free fluid. IMPRESSION: 1. Moderate left hydrosalpinx. 2. Otherwise normal pelvic ultrasound. Electronically Signed   By: Levell Reach M.D.   On: 09/24/2023 17:01      Rosena Conradi, MD  [facility] 09/26/2023  If 7PM-7AM, please contact night-coverage

## 2023-09-26 NOTE — TOC CM/SW Note (Signed)
    Durable Medical Equipment  (From admission, onward)           Start     Ordered   09/26/23 1202  For home use only DME 3 n 1  Once        09/26/23 1201   09/26/23 1201  For home use only DME standard manual wheelchair with seat cushion  Once       Comments: Patient suffers from weakness  which impairs their ability to perform daily activities like ambulate  in the home.  A cane  will not resolve issue with performing activities of daily living. A wheelchair will allow patient to safely perform daily activities. Patient can safely propel the wheelchair in the home or has a caregiver who can provide assistance. Length of need lifetime . Accessories: elevating leg rests (ELRs), wheel locks, extensions and anti-tippers.  Seat and back cushions   09/26/23 1201           Patient is confined to a room with no bathroom , therefore needs 3 in 1 / bedside commode

## 2023-09-26 NOTE — Progress Notes (Signed)
 Clear liquid tray ordered. Pt set to automatic trays

## 2023-09-26 NOTE — TOC Initial Note (Addendum)
 Transition of Care (TOC) - Initial/Assessment Note   Spoke to patient and daughter Melanie Long at bedside. Patient from home with husband.   Discussed PT/OT recommendations for HHPT/OT , bedside commode and wheelchair chusion.   Daughter in agreement . NCM asked Melanie Long size of patient's wheelchair to get correct cushion size or which DME agency the got the wheelchair from. They purchased wheelchair from [employer] and per daughter it is not in "good shape". Discuss ordering a new wheelchair through insurance, DME company will run insurance and discuss coverage directly with family. Daughter in agreement. DME will be delivered to hospital room and family will transport home.   Provided [name] with medicare.gov list of home health agencies with ratings for Melanie Long and her father to review. TOC will follow up for choice.  Patient Details  Name: Melanie Long MRN: [id number] Date of Birth: 19-May-1951  Transition of Care [facility]) CM/SW Contact:    Terre Ferri, RN Phone Number: 09/26/2023, 12:22 PM  Clinical Narrative:                   Expected Discharge Plan: Home w Home Health Services Barriers to Discharge: Continued Medical Work up   Patient Goals and CMS Choice Patient states their goals for this hospitalization and ongoing recovery are:: patient dementia, spoke to daughter Melanie Long at bedside, plan to go home at discharge CMS Medicare.gov Compare Post Acute Care list provided to:: Patient Represenative (must comment) (daughter Melanie Long) Choice offered to / list presented to : Adult Children      Expected Discharge Plan and Services   Discharge Planning Services: CM Consult Post Acute Care Choice: Home Health, Durable Medical Equipment Living arrangements for the past 2 months: Single Family Home                 DME Arranged: Wheelchair manual, 3-N-1 DME Agency: [employer] Date DME Agency Contacted: 09/26/23 Time DME Agency Contacted: 1221 Representative spoke with at DME  Agency: [facility] HH Arranged: PT, OT (provided daughter with medicare.gov list)          Prior Living Arrangements/Services Living arrangements for the past 2 months: Single Family Home Lives with:: Spouse Patient language and need for interpreter reviewed:: Yes Do you feel safe going back to the place where you live?: Yes      Need for Family Participation in Patient Care: Yes (Comment) Care giver support system in place?: Yes (comment) Current home services: DME Criminal Activity/Legal Involvement Pertinent to Current Situation/Hospitalization: No - Comment as needed  Activities of Daily Living   ADL Screening (condition at time of admission) Independently performs ADLs?: No Does the patient have a NEW difficulty with bathing/dressing/toileting/self-feeding that is expected to last >3 days?: No Does the patient have a NEW difficulty with getting in/out of bed, walking, or climbing stairs that is expected to last >3 days?: No Does the patient have a NEW difficulty with communication that is expected to last >3 days?: No Is the patient deaf or have difficulty hearing?: No Does the patient have difficulty seeing, even when wearing glasses/contacts?: No Does the patient have difficulty concentrating, remembering, or making decisions?: Yes  Permission Sought/Granted   Permission granted to share information with : Yes, Verbal Permission Granted  Share Information with NAME: daughter Melanie Long           Emotional Assessment Appearance:: Appears stated age Attitude/Demeanor/Rapport: Unable to Assess   Orientation: : Oriented to Self Alcohol / Substance Use: Not Applicable Psych  Involvement: No (comment)  Admission diagnosis:  Acute cholecystitis [K81.0] Abdominal wall hernia [K43.9] Patient Active Problem List   Diagnosis Date Noted   Acute cholecystitis 09/24/2023   Mixed hyperlipidemia 12/02/2022   Colon cancer screening    Cerumen impaction 11/21/2020   Family  history of breast cancer in mother 11/13/2019   Postoperative hypothyroidism 11/13/2019   Essential hypertension 11/13/2019   Dementia (HCC) 11/13/2019   Osteoarthritis 11/13/2019   PCP:  Gabriel John, NP Pharmacy:   [facility] [phone] Jonette Nestle, [place] - [address] AT [address] [place] [address] Phone: [phone] Fax: [phone]  [facility] #[id number] - [place], [place] - [address] [place] [address] AT [facility] [address] [place] [address] Phone: [phone] Fax: [phone]     Social Drivers of Health (SDOH) Social History: SDOH Screenings   Food Insecurity: No Food Insecurity (09/24/2023)  Housing: Patient Unable To Answer (09/24/2023)  Transportation Needs: Patient Unable To Answer (09/24/2023)  Utilities: Patient Unable To Answer (09/24/2023)  Alcohol Screen: Low Risk  (10/12/2022)  Depression (PHQ2-9): High Risk (12/02/2022)  Financial Resource Strain: Low Risk  (10/12/2022)  Physical Activity: Inactive (10/12/2022)  Social Connections: Unknown (09/24/2023)  Stress: No Stress Concern Present (10/12/2022)  Tobacco Use: Low Risk  (09/23/2023)   SDOH Interventions:     Readmission Risk Interventions     No data to display

## 2023-09-26 NOTE — Progress Notes (Signed)
 Able to sit on chair comfortably per patient's request.

## 2023-09-26 NOTE — Progress Notes (Signed)
 Progress Note     Subjective: Seen with Dr. Efrain Grant [facility]) and husband and daughter at beside. Husband states she is having the worst dementia symptoms she has had and wants to get her out of the hospital as soon as possible as he thinks being NPO and hospitalized is making her confusion worse. She just had a bowel movement. She is complaining of abdominal pain.   Objective: Vital signs in last 24 hours: Temp:  [97.5 F (36.4 C)-99.4 F (37.4 C)] 97.5 F (36.4 C) (02/10 0752) Pulse Rate:  [66-87] 80 (02/10 0752) Resp:  [16-18] 16 (02/10 0752) BP: (94-122)/(56-69) 97/57 (02/10 0752) SpO2:  [94 %-97 %] 94 % (02/10 0752) Weight:  [99.1 kg] 99.1 kg (02/10 0500) Last BM Date : 09/22/23  Intake/Output from previous day: 02/09 0701 - 02/10 0700 In: 1431.2 [I.V.:1131.2; IV Piggyback:300] Out: 250 [Urine:250] Intake/Output this shift: No intake/output data recorded.  PE: General: pleasant, WD, female who is laying in bed in NAD Lungs:Respiratory effort nonlabored Abd: soft, she complains of pain diffusely, including in the RUQ. She has multiple abdominal hernias which are soft and reducible MSK: all 4 extremities are symmetrical with no cyanosis, clubbing, or edema. Skin: warm and dry Psych: A&Ox3 with an appropriate affect.    Lab Results:  Recent Labs    09/25/23 0513 09/26/23 0603  WBC 17.1* 12.2*  HGB 14.9 13.5  HCT 44.5 39.2  PLT 126* 120*   BMET Recent Labs    09/25/23 0513 09/26/23 0603  NA 138 137  K 3.6 3.1*  CL 103 102  CO2 24 26  GLUCOSE 113* 93  BUN 15 18  CREATININE 0.79 0.72  CALCIUM  8.4* 7.9*   PT/INR No results for input(s): "LABPROT", "INR" in the last 72 hours. CMP     Component Value Date/Time   NA 137 09/26/2023 0603   K 3.1 (L) 09/26/2023 0603   CL 102 09/26/2023 0603   CO2 26 09/26/2023 0603   GLUCOSE 93 09/26/2023 0603   BUN 18 09/26/2023 0603   CREATININE 0.72 09/26/2023 0603   CALCIUM  7.9 (L) 09/26/2023 0603   PROT 5.8 (L)  09/26/2023 0603   ALBUMIN 2.4 (L) 09/26/2023 0603   AST 18 09/26/2023 0603   ALT 12 09/26/2023 0603   ALKPHOS 46 09/26/2023 0603   BILITOT 0.5 09/26/2023 0603   GFRNONAA >60 09/26/2023 0603   Lipase     Component Value Date/Time   LIPASE 29 09/23/2023 2258       Studies/Results: US  Abdomen Limited RUQ (LIVER/GB) Result Date: 09/25/2023 CLINICAL DATA:  Abdominal pain. EXAM: ULTRASOUND ABDOMEN LIMITED RIGHT UPPER QUADRANT COMPARISON:  CT abdomen pelvis dated 09/24/2023. FINDINGS: Evaluation is limited due to body habitus and overlying bowel gas. Gallbladder: There is a 1.6 cm gallstone. The gallbladder wall appears thickened and edematous measuring 5 mm. No significant pericholecystic fluid. Positive sonographic Murphy's sign reported. Common bile duct: Diameter: Not visualized. Liver: The liver is suboptimally visualized and poorly evaluated. Other: None. IMPRESSION: Cholelithiasis with findings of acute cholecystitis. Electronically Signed   By: Angus Bark M.D.   On: 09/25/2023 19:21   US  PELVIS (TRANSABDOMINAL ONLY) Result Date: 09/24/2023 CLINICAL DATA:  73 year old female with incidental left adnexal mass on CT. EXAM: TRANSABDOMINAL ULTRASOUND OF PELVIS TECHNIQUE: Transabdominal ultrasound examination of the pelvis was performed including evaluation of the uterus, ovaries, adnexal regions, and pelvic cul-de-sac. COMPARISON:  CT abdomen/pelvis from earlier today FINDINGS: Uterus Measurements: 6.6 x 3.2 x 4.2 cm = volume: 46 mL.  Anteverted uterus is normal in size and configuration with no uterine fibroids or other myometrial abnormalities. Endometrium Thickness: 5 mm. No endometrial cavity fluid or focal endometrial mass. Right ovary Measurements: 1.5 x 1.0 x 1.7 cm = volume: 1.3 mL. Normal appearance/no adnexal mass. Left ovary Measurements: 1.8 x 1.4 x 1.9 cm = volume: 2.5 mL. Tubular 3.9 x 2.2 x 5.9 cm left adnexal cystic structure with incomplete eccentric thin internal septations,  most compatible with moderate left hydrosalpinx. Separate normal left ovary. Other findings:  No abnormal free fluid. IMPRESSION: 1. Moderate left hydrosalpinx. 2. Otherwise normal pelvic ultrasound. Electronically Signed   By: Levell Reach M.D.   On: 09/24/2023 17:01    Anti-infectives: Anti-infectives (From admission, onward)    Start     Dose/Rate Route Frequency Ordered Stop   09/25/23 0500  cefTRIAXone  (ROCEPHIN ) 2 g in sodium chloride  0.9 % 100 mL IVPB        2 g 200 mL/hr over 30 Minutes Intravenous Every 24 hours 09/24/23 0803     09/24/23 0815  metroNIDAZOLE  (FLAGYL ) IVPB 500 mg        500 mg 100 mL/hr over 60 Minutes Intravenous Every 12 hours 09/24/23 0803     09/24/23 0445  cefTRIAXone  (ROCEPHIN ) 2 g in sodium chloride  0.9 % 100 mL IVPB        2 g 200 mL/hr over 30 Minutes Intravenous  Once 09/24/23 0436 09/24/23 0519        Assessment/Plan 73 y/o F w/ a hx of severe dementia who presented with abdominal pain  ?Acute cholecystitis  - WBC 12.2 from 17.1 this AM (normal on admission) - LFT's normal - she is diffusely tender on exam today but also with worsening confusion - RUQ US  with 1.6 cm gallstone and findings of acute cholecystitis  - given abdominal surgery history and multiple herniae want to avoid cholecystectomy. Hopefully cholecystitis improves with antibiotics but also need to consider perc chole if not. With the gallstones seen on imaging, I am concerned she will have recurrent cholecystitis regardless and may ultimately need a perc chole tube long ter. I discussed this with husband and daughter. Husband is very motivated for patient to discharge as soon as possible given her worsening dementia. Will do po trial today and try to advance to reg diet this afternoon. Recheck labs and exam in the am and consider perc chole if not continuing to improve. NPO MN for am eval. ?SBO             - no nausea or emesis  - BM this am L Ovarian vs. Periovarian Mass              - pelvic USN shows hydrosalpinx - normal ovary   FEN - FLD adat to reg this afternoon and NPO MN VTE - Holding for possible procedure ID - Rocephin /flagyl    I reviewed hospitalist notes, last 24 h vitals and pain scores, last 48 h intake and output, last 24 h labs and trends, and last 24 h imaging results.    LOS: 2 days   Elwin Hammond, [facility] Surgery 09/26/2023, 8:10 AM Please see Amion for pager number during day hours 7:00am-4:30pm

## 2023-09-27 DIAGNOSIS — K810 Acute cholecystitis: Secondary | ICD-10-CM | POA: Diagnosis not present

## 2023-09-27 LAB — COMPREHENSIVE METABOLIC PANEL
ALT: 14 U/L (ref 0–44)
AST: 20 U/L (ref 15–41)
Albumin: 2.4 g/dL — ABNORMAL LOW (ref 3.5–5.0)
Alkaline Phosphatase: 48 U/L (ref 38–126)
Anion gap: 12 (ref 5–15)
BUN: 12 mg/dL (ref 8–23)
CO2: 23 mmol/L (ref 22–32)
Calcium: 7.6 mg/dL — ABNORMAL LOW (ref 8.9–10.3)
Chloride: 100 mmol/L (ref 98–111)
Creatinine, Ser: 0.72 mg/dL (ref 0.44–1.00)
GFR, Estimated: >60 mL/min (ref >60)
Glucose, Bld: 95 mg/dL (ref 70–99)
Potassium: 2.9 mmol/L — ABNORMAL LOW (ref 3.5–5.1)
Sodium: 135 mmol/L (ref 135–145)
Total Bilirubin: 0.5 mg/dL (ref 0.0–1.2)
Total Protein: 5.6 g/dL — ABNORMAL LOW (ref 6.5–8.1)

## 2023-09-27 LAB — CBC
HCT: 38.1 % (ref 36.0–46.0)
Hemoglobin: 12.7 g/dL (ref 12.0–15.0)
MCH: 30.7 pg (ref 26.0–34.0)
MCHC: 33.3 g/dL (ref 30.0–36.0)
MCV: 92 fL (ref 80.0–100.0)
Platelets: 102 10*3/uL — ABNORMAL LOW (ref 150–400)
RBC: 4.14 MIL/uL (ref 3.87–5.11)
RDW: 13.1 % (ref 11.5–15.5)
WBC: 7.9 10*3/uL (ref 4.0–10.5)
nRBC: 0 % (ref 0.0–0.2)

## 2023-09-27 MED ORDER — CEFUROXIME AXETIL 500 MG PO TABS: 500.0000 mg | ORAL_TABLET | Freq: Two times a day (BID) | ORAL | 0 refills | Status: AC

## 2023-09-27 MED ORDER — TRAZODONE HCL 50 MG PO TABS
25.0000 mg | ORAL_TABLET | Freq: Once | ORAL | Status: AC
  Administered 2023-09-27: 25 mg via ORAL
  Filled 2023-09-27: qty 1

## 2023-09-27 MED ORDER — POTASSIUM CHLORIDE 10 MEQ/100ML IV SOLN
10.0000 meq | INTRAVENOUS | Status: AC
  Administered 2023-09-27 (×4): 10 meq via INTRAVENOUS
  Filled 2023-09-27 (×4): qty 100

## 2023-09-27 MED ORDER — GUAIFENESIN-DM 100-10 MG/5ML PO SYRP: 5.0000 mL | ORAL_SOLUTION | ORAL | 0 refills | Status: DC | PRN

## 2023-09-27 MED ORDER — POLYETHYLENE GLYCOL 3350 17 G PO PACK: 17.0000 g | PACK | Freq: Every day | ORAL | 0 refills | Status: DC | PRN

## 2023-09-27 MED ORDER — POTASSIUM CHLORIDE CRYS ER 20 MEQ PO TBCR
40.0000 meq | EXTENDED_RELEASE_TABLET | Freq: Once | ORAL | Status: AC
  Administered 2023-09-27: 40 meq via ORAL
  Filled 2023-09-27: qty 2

## 2023-09-27 MED ORDER — METRONIDAZOLE 500 MG PO TABS: 500.0000 mg | ORAL_TABLET | Freq: Two times a day (BID) | ORAL | 0 refills | Status: AC

## 2023-09-27 NOTE — Progress Notes (Signed)
Pt discharged to home with husband and daughter via wheelchair with all belongings and paperwork

## 2023-09-27 NOTE — Progress Notes (Signed)
Progress Note     Subjective: Husband at bedside. She is more calm this am though husband reports she was agitated overnight. She has not complained of abdominal pain. She really only had clear liquids yesterday. She is oriented to self only  Objective: Vital signs in last 24 hours: Temp:  [97.7 F (36.5 C)-98.4 F (36.9 C)] 97.7 F (36.5 C) (02/11 0551) Pulse Rate:  [74-84] 74 (02/11 0551) Resp:  [16-18] 18 (02/11 0551) BP: (109-124)/(57-68) 124/68 (02/11 0551) SpO2:  [97 %-99 %] 99 % (02/11 0551) Weight:  [100.3 kg] 100.3 kg (02/11 0500) Last BM Date : 09/27/23  Intake/Output from previous day: 02/10 0701 - 02/11 0700 In: 900 [P.O.:100; IV Piggyback:800] Out: -  Intake/Output this shift: No intake/output data recorded.  PE: General: pleasant, WD, female who is laying in bed in NAD Lungs:Respiratory effort nonlabored Abd: soft, she grimaces with palpation of entire upper abdomen. No grimace with palpation of lower abdomen. She has multiple abdominal hernias which are soft and reducible MSK: all 4 extremities are symmetrical with no cyanosis, clubbing, or edema. Skin: warm and dry   Lab Results:  Recent Labs    09/26/23 0603 09/27/23 0601  WBC 12.2* 7.9  HGB 13.5 12.7  HCT 39.2 38.1  PLT 120* 102*   BMET Recent Labs    09/26/23 0603 09/27/23 0601  NA 137 135  K 3.1* 2.9*  CL 102 100  CO2 26 23  GLUCOSE 93 95  BUN 18 12  CREATININE 0.72 0.72  CALCIUM 7.9* 7.6*   PT/INR No results for input(s): "LABPROT", "INR" in the last 72 hours. CMP     Component Value Date/Time   NA 135 09/27/2023 0601   K 2.9 (L) 09/27/2023 0601   CL 100 09/27/2023 0601   CO2 23 09/27/2023 0601   GLUCOSE 95 09/27/2023 0601   BUN 12 09/27/2023 0601   CREATININE 0.72 09/27/2023 0601   CALCIUM 7.6 (L) 09/27/2023 0601   PROT 5.6 (L) 09/27/2023 0601   ALBUMIN 2.4 (L) 09/27/2023 0601   AST 20 09/27/2023 0601   ALT 14 09/27/2023 0601   ALKPHOS 48 09/27/2023 0601   BILITOT  0.5 09/27/2023 0601   GFRNONAA >60 09/27/2023 0601   Lipase     Component Value Date/Time   LIPASE 29 09/23/2023 2258       Studies/Results: US Abdomen Limited RUQ (LIVER/GB) Result Date: 09/25/2023 CLINICAL DATA:  Abdominal pain. EXAM: ULTRASOUND ABDOMEN LIMITED RIGHT UPPER QUADRANT COMPARISON:  CT abdomen pelvis dated 09/24/2023. FINDINGS: Evaluation is limited due to body habitus and overlying bowel gas. Gallbladder: There is a 1.6 cm gallstone. The gallbladder wall appears thickened and edematous measuring 5 mm. No significant pericholecystic fluid. Positive sonographic Murphy's sign reported. Common bile duct: Diameter: Not visualized. Liver: The liver is suboptimally visualized and poorly evaluated. Other: None. IMPRESSION: Cholelithiasis with findings of acute cholecystitis. Electronically Signed   By: Elgie Collard M.D.   On: 09/25/2023 19:21    Anti-infectives: Anti-infectives (From admission, onward)    Start     Dose/Rate Route Frequency Ordered Stop   09/25/23 0500  cefTRIAXone (ROCEPHIN) 2 g in sodium chloride 0.9 % 100 mL IVPB        2 g 200 mL/hr over 30 Minutes Intravenous Every 24 hours 09/24/23 0803     09/24/23 0815  metroNIDAZOLE (FLAGYL) IVPB 500 mg        500 mg 100 mL/hr over 60 Minutes Intravenous Every 12 hours 09/24/23 0803  09/24/23 0445  cefTRIAXone (ROCEPHIN) 2 g in sodium chloride 0.9 % 100 mL IVPB        2 g 200 mL/hr over 30 Minutes Intravenous  Once 09/24/23 0436 09/24/23 0519        Assessment/Plan 73 y/o F w/ a hx of severe dementia who presented with abdominal pain  ?Acute cholecystitis  - WBC has normalized - LFT's normal - abdominal exam is overall reassuring though limited by MS. She has upper abdominal pain across upper abdomen - RUQ [place] with 1.6 cm gallstone and findings of acute cholecystitis  - she had clear liquids yesterday without apparent worsening abdominal pain and WBC normalized. Husband is very motivated for patient to  dc today in regard to her worsening dementia while admitted. He wants to avoid perc chole if possible. If placed perc chole would likely be long term. Will advance diet today and if tolerates without significant abdominal pain she can discharge today from surgical standpoint with antibiotics and strict return precautions. We discussed at length signs/symptoms of worsening cholecystitis and increased concern that baseline dementia could at least initially obscure worsening symptoms.   ?SBO             - no nausea or emesis  - having bowel movements L Ovarian vs. Periovarian Mass             - pelvic USN shows hydrosalpinx - normal ovary   FEN - HH VTE - Holding for possible procedure ID - Rocephin/flagyl   I reviewed hospitalist notes, last 24 h vitals and pain scores, last 48 h intake and output, last 24 h labs and trends, and last 24 h imaging results.    LOS: 3 days   Eric Form, [facility] 09/27/2023, 8:06 AM Please see Amion for pager number during day hours 7:00am-4:30pm

## 2023-09-27 NOTE — Progress Notes (Signed)
Discharge instructions given to pt and husband Barrett Ashline. Husband verbalized understanding of report and had no further questions.

## 2023-09-27 NOTE — Progress Notes (Signed)
Tele box 6N04 taken off pt and returned to drawer at nurses station

## 2023-09-27 NOTE — Progress Notes (Signed)
wheelchair and bedside commode taken at discharge by pt daughter Shauna Hugh

## 2023-09-27 NOTE — Plan of Care (Signed)
  Problem: Pain Managment: Goal: General experience of comfort will improve and/or be controlled Outcome: Progressing   Problem: Safety: Goal: Ability to remain free from injury will improve Outcome: Progressing

## 2023-09-27 NOTE — TOC Progression Note (Addendum)
Transition of Care (TOC) - Progression Note   Followed up with patient's daughter and husband at bedside regarding home health agency choice. They would like [facility] . Left message for [facility] with [facility] . Await call back. Kandee Keen with [facility] accepted home health order.   Called Vaughan Basta with [employer] for wheelchair and bedside commode  Patient Details  Name: Melanie Long MRN: [id number] Date of Birth: 1950/10/07  [facility] CM/SW Contact  Navi Ewton, Adria Devon, RN Phone Number: 09/27/2023, 10:42 AM  Clinical Narrative:       Expected Discharge Plan: Home w Home Health Services Barriers to Discharge: Continued Medical Work up  Expected Discharge Plan and Services   Discharge Planning Services: CM Consult Post Acute Care Choice: Home Health, Durable Medical Equipment Living arrangements for the past 2 months: Single Family Home                 DME Arranged: Wheelchair manual, 3-N-1 DME Agency: [employer] Date DME Agency Contacted: 09/26/23 Time DME Agency Contacted: 1221 Representative spoke with at DME Agency: Orvan July HH Arranged: PT, OT (provided daughter with medicare.gov list)           Social Determinants of Health (SDOH) Interventions SDOH Screenings   Food Insecurity: No Food Insecurity (09/24/2023)  Housing: Patient Unable To Answer (09/24/2023)  Transportation Needs: Patient Unable To Answer (09/24/2023)  Utilities: Patient Unable To Answer (09/24/2023)  Alcohol Screen: Low Risk  (10/12/2022)  Depression (PHQ2-9): High Risk (12/02/2022)  Financial Resource Strain: Low Risk  (10/12/2022)  Physical Activity: Inactive (10/12/2022)  Social Connections: Unknown (09/24/2023)  Stress: No Stress Concern Present (10/12/2022)  Tobacco Use: Low Risk  (09/23/2023)    Readmission Risk Interventions     No data to display

## 2023-09-27 NOTE — Discharge Summary (Signed)
Physician Discharge Summary  Melanie Long WUJ:[id number] DOB: 1950/09/27 DOA: 09/23/2023  PCP: Doreene Nest, NP  Admit date: 09/23/2023 Discharge date: 09/27/2023  Admitted From: Home  Discharge disposition: Home   Recommendations for Outpatient Follow-Up:   Follow up with your primary care provider in one week.  Check CBC, BMP, magnesium in the next visit   Discharge Diagnosis:   Principal Problem:   Acute cholecystitis Active Problems:   Postoperative hypothyroidism   Essential hypertension   Dementia (HCC)   Mixed hyperlipidemia   Discharge Condition: Improved.  Diet recommendation: Soft, advance as tolerated  Wound care: None.  Code status: Full.   History of Present Illness:   Melanie Long  is a 73 y.o. 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  She was having bowel movements and passing gas.  In the ED, patient had normal WBC.  Was afebrile.  LFTs were within normal limits.  Imaging showed acute cholecystitis with cholelithiasis, multiple ventral hernias  and bowel with mild small bowel dilation, as well left ovarian versus periovarian mass. Patient was then considered for admission to hospital for further evaluation and treatment.    Hospital Course:   Following conditions were addressed during hospitalization as listed below,  Acute cholecystitis Cholelithiasis Questionable small bowel obstruction on imaging Surgery consulted during hospitalization today LFTs were within normal limits.  Patient was empirically treated for acute cholecystitis with IV Rocephin and Flagyl.  Right upper quadrant ultrasound showed cholelithiasis with cholecystitis.  At this time patient  has less pain and family strongly wishes to take the patient home.  Spoke with surgery team as well.  If the patient is tolerating orally we will transition to oral antibiotics for next 1 week on discharge.  Spoke with the patient's son at length regarding the possibility of recurrent gallbladder infection, possible perforation/worsening symptoms.  During hospitalization discussed about percutaneous cholecystostomy tube with has been deferred at this time.    Not a good surgical candidate due to multiple hernias, advanced hernia.  No fever and leukocytosis   Left ovarian versus paraovarian mass -Colonoscopy 2022 with no acute findings.  Ultrasound of the pelvis showed a left moderate hydrosalpinx otherwise normal pelvic ultrasound.   Hypertension Continue benazepril.  Continue as needed hydralazine.   History of seizures Continue Depakote   Advanced dementia with agitation, confusion.   Continue Namenda.  Patient had hospital induced delirium and family wished to take her home as soon as possible.   Hyperlipidemia   Hypo-thyroidism Continue Synthroid    Disposition.  At this time, patient is stable for disposition home with outpatient PCP follow-up.  Advised to seek medical attention for worsening symptoms  Medical Consultants:   General surgery.  Procedures:    None Subjective:   Today, patient was seen and examined at bedside.  Poor historian.  Son at bedside.  Reported that that she was very confused agitated during the nighttime and wants to take her home today.  Discharge Exam:   Vitals:   09/27/23 0551 09/27/23 0845  BP: 124/68 122/62  Pulse: 74 71  Resp: 18 18  Temp: 97.7 F (36.5 C) 98 F (36.7 C)  SpO2: 99% 99%   Vitals:   09/26/23 2104 09/27/23 0500 09/27/23 0551 09/27/23 0845  BP: (!) 109/59  124/68 122/62  Pulse: 84  74 71  Resp: 16  18 18   Temp: 98.4 F (36.9 C)  97.7 F (36.5 C) 98 F (36.7 C)  TempSrc: Oral  Oral   SpO2: 98%  99% 99%  Weight:   100.3 kg  99.7 kg  Height:       Body mass index is 34.43 kg/m.  General: Alert awake, not in obvious distress, disoriented, advanced dementia, obese built HENT: pupils equally reacting to light,  No scleral pallor or icterus noted. Oral mucosa is moist.  Chest:  Clear breath sounds.  No crackles or wheezes.  CVS: S1 &S2 heard. No murmur.  Regular rate and rhythm. Abdomen: Soft, nontender over the right upper quadrant today, nondistended.  Bowel sounds are heard.   Extremities: No cyanosis, clubbing or edema.  Peripheral pulses are palpable. Psych: Alert, awake and but disoriented, Communicative, has advanced dementia, CNS:  No cranial nerve deficits.  Power equal in all extremities.   Skin: Warm and dry.  No rashes noted.  The results of significant diagnostics from this hospitalization (including imaging, microbiology, ancillary and laboratory) are listed below for reference.     Diagnostic Studies:   US PELVIS (TRANSABDOMINAL ONLY) Result Date: 09/24/2023 CLINICAL DATA:  73 year old female with incidental left adnexal mass on CT. EXAM: TRANSABDOMINAL ULTRASOUND OF PELVIS TECHNIQUE: Transabdominal ultrasound examination of the pelvis was performed including evaluation of the uterus, ovaries, adnexal regions, and pelvic cul-de-sac. COMPARISON:  CT abdomen/pelvis from earlier today FINDINGS: Uterus Measurements: 6.6 x 3.2 x 4.2 cm = volume: 46 mL. Anteverted uterus is normal in size and configuration with no uterine fibroids or other myometrial abnormalities. Endometrium Thickness: 5 mm. No endometrial cavity fluid or focal endometrial mass. Right ovary Measurements: 1.5 x 1.0 x 1.7 cm = volume: 1.3 mL. Normal appearance/no adnexal mass. Left ovary Measurements: 1.8 x 1.4 x 1.9 cm = volume: 2.5 mL. Tubular 3.9 x 2.2 x 5.9 cm left adnexal cystic structure with incomplete eccentric thin internal septations, most compatible with moderate left hydrosalpinx. Separate normal left ovary. Other findings:   No abnormal free fluid. IMPRESSION: 1. Moderate left hydrosalpinx. 2. Otherwise normal pelvic ultrasound. Electronically Signed   By: Delbert Phenix M.D.   On: 09/24/2023 17:01   CT ABDOMEN PELVIS W CONTRAST Result Date: 09/24/2023 CLINICAL DATA:  Abdominal pain. EXAM: CT ABDOMEN AND PELVIS WITH CONTRAST TECHNIQUE: Multidetector CT imaging of the abdomen and pelvis was performed using the standard protocol following bolus administration of intravenous contrast. RADIATION DOSE REDUCTION: This exam was performed according to the departmental dose-optimization program which includes automated exposure control, adjustment of the mA and/or kV according to patient size and/or use of iterative reconstruction technique. CONTRAST:  75mL OMNIPAQUE IOHEXOL 350 MG/ML SOLN COMPARISON:  None Available. FINDINGS: Lower chest: Lung bases show posterior atelectasis without infiltrates. Mild elevation of the right diaphragm. The cardiac size is normal. There are scattered single-vessel calcifications in the LAD coronary artery. Minimal pericardial effusion. Hepatobiliary: The liver enhances homogeneously. No mass is seen. No biliary dilatation. The gallbladder is dilated measuring 13 cm in length with wall thickening, pericholecystic fluid and stones. Findings most likely due to acute cholecystitis. There is a 2.8 cm stone in the gallbladder neck which may well be impacted. There are a few more stones more distally. The largest fundal stone is 4 cm. Pancreas: Partially atrophic.  Focal abnormality. Spleen: No mass.  No splenomegaly. Adrenals/Urinary Tract: Adrenal glands are unremarkable. Kidneys are normal, without renal calculi, focal lesion, or hydronephrosis. Bladder  is unremarkable. Stomach/Bowel: Small hiatal hernia. Unremarkable stomach. In the abdomen and pelvis proper, the small bowel is normal in caliber. There is a wide mouth supraumbilical midline hernia through an 8 cm wide wall defect containing a portion of the mid  transverse colon. Beginning just below this hernia, there is another wide mouth hernia through a 7 cm wide defect, containing a portion of the proximal to mid transverse colon and a loop of the small bowel which is normal caliber. This hernia sac ends just above the umbilicus. At and below the umbilicus, there is wide rectus diastasis as much as 11 cm in diameter and a large complex ventral hernia with the sac extending down as far as the level of the pubic bone, containing the cecum and proximal ascending colon and multiple small bowel segments some of which are mildly dilated up to 2.6 cm. This could indicate ileus or low-grade small bowel obstruction within the hernia although a discrete transition was not convincingly seen. There is a fourth hernia of the anterolateral left lower abdominal wall through a 4 cm wide defect containing a portion of the distal descending/proximal sigmoid colon, and appears to be nonobstructing. The appendix is surgically absent. No thickening is seen in the bowel wall. No pneumatosis. Vascular/Lymphatic: Aortic atherosclerosis. No enlarged abdominal or pelvic lymph nodes. Reproductive: The uterus is intact. The right ovary is unremarkable. There is a left ovarian versus paraovarian mass measuring 5.0 x 3.2 x 4 cm, homogeneously low in attenuation measuring 27 Hounsfield units. Pelvic ultrasound is recommended. Multiple pelvic phleboliths. Other: No free fluid, free hemorrhage or free air. No edema is seen in the above hernia sacs. Musculoskeletal: There is extensive bridging enthesopathy of the thoracic spine, degenerative changes and spondylosis lumbar spine with advanced facet hypertrophy in the lumbar region from L3 down. Unilateral left pars defect appears chronic at L5 with slight grade 1 L5-S1 spondylolisthesis. No acute or other significant osseous findings. The SI joints are patent. IMPRESSION: 1. Findings consistent with acute cholecystitis, with a 2.8 cm stone in the  gallbladder neck which may be impacted. Additional stones up to 4 cm more distally. 2. Aortic and single-vessel coronary artery atherosclerosis. 3. Small hiatal hernia. 4. Four abdominal wall hernias, 2 above the umbilicus, one below the umbilicus, and one in the anterolateral left lower abdominal wall. 5. The 2 most cephalad above hernias contain portions of the transverse colon and small bowel, and the largest ventral hernia contains the cecum and proximal ascending colon and multiple small bowel segments some of which are mildly dilated up to 2.6 cm. This could indicate ileus or low-grade small bowel obstruction within the hernia although a discrete transition was not convincingly seen. 6. 5.0 x 3.2 x 4 cm left ovarian versus paraovarian mass. Pelvic ultrasound is recommended. 7. Chronic left L5 pars defect with slight grade 1 L5-S1 spondylolisthesis. 8. These results will be called to the ordering clinician or representative by the Radiologist Assistant, and communication documented in the PACS or [employer]. Aortic Atherosclerosis (ICD10-I70.0). Electronically Signed   By: Almira Bar M.D.   On: 09/24/2023 04:20   DG Chest Port 1 View Result Date: 09/23/2023 CLINICAL DATA:  Altered mental status and body aches. EXAM: PORTABLE CHEST 1 VIEW COMPARISON:  None Available. FINDINGS: The heart size and mediastinal contours are within normal limits. There is scattered aortic calcific plaque. Both lungs are clear with slightly elevated right diaphragm. The visualized skeletal structures are intact, with thoracic spondylosis. IMPRESSION: No evidence of  acute chest disease. Aortic atherosclerosis. Electronically Signed   By: Almira Bar M.D.   On: 09/23/2023 23:05     Labs:   Basic Metabolic Panel: Recent Labs  Lab 09/23/23 2258 09/25/23 0513 09/26/23 0603 09/27/23 0601  NA 138 138 137 135  K 4.0 3.6 3.1* 2.9*  CL 103 103 102 100  CO2 21* 24 26 23   GLUCOSE 132* 113* 93 95  BUN 25* 15 18 12    CREATININE 0.84 0.79 0.72 0.72  CALCIUM 8.9 8.4* 7.9* 7.6*  MG  --   --  1.9  --    GFR Estimated Creatinine Clearance: 79.3 mL/min (by C-G formula based on SCr of 0.72 mg/dL). Liver Function Tests: Recent Labs  Lab 09/23/23 2258 09/25/23 0513 09/26/23 0603 09/27/23 0601  AST 18 21 18 20   ALT 14 12 12 14   ALKPHOS 53 50 46 48  BILITOT 0.6 0.6 0.5 0.5  PROT 6.7 6.2* 5.8* 5.6*  ALBUMIN 3.4* 2.9* 2.4* 2.4*   Recent Labs  Lab 09/23/23 2258  LIPASE 29   No results for input(s): "AMMONIA" in the last 168 hours. Coagulation profile No results for input(s): "INR", "PROTIME" in the last 168 hours.  CBC: Recent Labs  Lab 09/23/23 2258 09/25/23 0513 09/26/23 0603 09/27/23 0601  WBC 8.8 17.1* 12.2* 7.9  NEUTROABS 5.8  --   --   --   HGB 14.5 14.9 13.5 12.7  HCT 43.2 44.5 39.2 38.1  MCV 92.1 91.0 90.7 92.0  PLT 145* 126* 120* 102*   Cardiac Enzymes: Recent Labs  Lab 09/23/23 2258  CKTOTAL 47   BNP: Invalid input(s): "POCBNP" CBG: No results for input(s): "GLUCAP" in the last 168 hours. D-Dimer No results for input(s): "DDIMER" in the last 72 hours. Hgb A1c No results for input(s): "HGBA1C" in the last 72 hours. Lipid Profile No results for input(s): "CHOL", "HDL", "LDLCALC", "TRIG", "CHOLHDL", "LDLDIRECT" in the last 72 hours. Thyroid function studies No results for input(s): "TSH", "T4TOTAL", "T3FREE", "THYROIDAB" in the last 72 hours.  Invalid input(s): "FREET3" Anemia work up No results for input(s): "VITAMINB12", "FOLATE", "FERRITIN", "TIBC", "IRON", "RETICCTPCT" in the last 72 hours. Microbiology Recent Results (from the past 240 hours)  Resp panel by RT-PCR (RSV, Flu A&B, Covid) Anterior Nasal Swab     Status: None   Collection Time: 09/23/23 11:04 PM   Specimen: Anterior Nasal Swab  Result Value Ref Range Status   SARS Coronavirus 2 by RT PCR NEGATIVE NEGATIVE Final   Influenza A by PCR NEGATIVE NEGATIVE Final   Influenza B by PCR NEGATIVE NEGATIVE  Final    Comment: (NOTE) The Xpert Xpress SARS-CoV-2/FLU/RSV plus assay is intended as an aid in the diagnosis of influenza from Nasopharyngeal swab specimens and should not be used as a sole basis for treatment. Nasal washings and aspirates are unacceptable for Xpert Xpress SARS-CoV-2/FLU/RSV testing.  Fact Sheet for Patients: BloggerCourse.com  Fact Sheet for Healthcare Providers: SeriousBroker.it  This test is not yet approved or cleared by the [place] FDA and has been authorized for detection and/or diagnosis of SARS-CoV-2 by FDA under an Emergency Use Authorization (EUA). This EUA will remain in effect (meaning this test can be used) for the duration of the COVID-19 declaration under Section 564(b)(1) of the Act, 21 U.S.C. section 360bbb-3(b)(1), unless the authorization is terminated or revoked.     Resp Syncytial Virus by PCR NEGATIVE NEGATIVE Final    Comment: (NOTE) Fact Sheet for Patients: BloggerCourse.com  Fact Sheet for Healthcare  Providers: SeriousBroker.it  This test is not yet approved or cleared by the [place] and has been authorized for detection and/or diagnosis of SARS-CoV-2 by FDA under an Emergency Use Authorization (EUA). This EUA will remain in effect (meaning this test can be used) for the duration of the COVID-19 declaration under Section 564(b)(1) of the Act, 21 U.S.C. section 360bbb-3(b)(1), unless the authorization is terminated or revoked.  Performed at [facility], [address] [place], [place] [address]   Respiratory (~20 pathogens) panel by PCR     Status: None   Collection Time: 09/23/23 11:04 PM   Specimen: Nasopharyngeal Swab; Respiratory  Result Value Ref Range Status   Adenovirus NOT DETECTED NOT DETECTED Final   Coronavirus 229E NOT DETECTED NOT DETECTED Final    Comment: (NOTE) The Coronavirus on the  Respiratory Panel, DOES NOT test for the novel  Coronavirus (2019 nCoV)    Coronavirus HKU1 NOT DETECTED NOT DETECTED Final   Coronavirus NL63 NOT DETECTED NOT DETECTED Final   Coronavirus OC43 NOT DETECTED NOT DETECTED Final   Metapneumovirus NOT DETECTED NOT DETECTED Final   Rhinovirus / Enterovirus NOT DETECTED NOT DETECTED Final   Influenza A NOT DETECTED NOT DETECTED Final   Influenza B NOT DETECTED NOT DETECTED Final   Parainfluenza Virus 1 NOT DETECTED NOT DETECTED Final   Parainfluenza Virus 2 NOT DETECTED NOT DETECTED Final   Parainfluenza Virus 3 NOT DETECTED NOT DETECTED Final   Parainfluenza Virus 4 NOT DETECTED NOT DETECTED Final   Respiratory Syncytial Virus NOT DETECTED NOT DETECTED Final   Bordetella pertussis NOT DETECTED NOT DETECTED Final   Bordetella Parapertussis NOT DETECTED NOT DETECTED Final   Chlamydophila pneumoniae NOT DETECTED NOT DETECTED Final   Mycoplasma pneumoniae NOT DETECTED NOT DETECTED Final    Comment: Performed at [facility], [address], [place], [place] [address]  Urine Culture     Status: Abnormal   Collection Time: 09/24/23 12:44 AM   Specimen: Urine, Clean Catch  Result Value Ref Range Status   Specimen Description URINE, CLEAN CATCH  Final   Special Requests NONE  Final   Culture (A)  Final    30,000 COLONIES/mL STAPHYLOCOCCUS HAEMOLYTICUS 70,000 COLONIES/mL DIPHTHEROIDS(CORYNEBACTERIUM SPECIES) Standardized susceptibility testing for this organism is not available. Performed at [facility], [address] [place], [place] [address]    Report Status 09/26/2023 FINAL  Final   Organism ID, Bacteria STAPHYLOCOCCUS HAEMOLYTICUS (A)  Final      Susceptibility   Staphylococcus haemolyticus - MIC*    CIPROFLOXACIN <=0.5 SENSITIVE Sensitive     GENTAMICIN <=0.5 SENSITIVE Sensitive     NITROFURANTOIN <=16 SENSITIVE Sensitive     OXACILLIN <=0.25 SENSITIVE Sensitive     TETRACYCLINE <=1 SENSITIVE Sensitive     VANCOMYCIN  <=0.5 SENSITIVE Sensitive     TRIMETH/SULFA <=10 SENSITIVE Sensitive     RIFAMPIN <=0.5 SENSITIVE Sensitive     Inducible Clindamycin NEGATIVE Sensitive     * 30,000 COLONIES/mL STAPHYLOCOCCUS HAEMOLYTICUS     Discharge Instructions:   Discharge Instructions     Diet - low sodium heart healthy   Complete by: As directed    Discharge instructions   Complete by: As directed    Follow-up with your primary care provider as outpatient in 1 week.  Check blood work at that time.  Advance diet as tolerated.  Seek medical attention for worsening symptoms.   Increase activity slowly   Complete by: As directed  Allergies as of 09/27/2023       Reactions   Ciprofloxacin Other (See Comments)   Unknown reaction   Elemental Sulfur Other (See Comments)   Unknown reaction   Penicillins Other (See Comments)   Unknown reaction   Sulfa Antibiotics Other (See Comments)   Unknown reaction   Tetanus-diphth-acell Pertussis Other (See Comments)   Unknown reaction        Medication List     TAKE these medications    aspirin 325 MG tablet Take 325 mg by mouth daily as needed for moderate pain (pain score 4-6).   atorvastatin 10 MG tablet Commonly known as: LIPITOR Take 1 tablet (10 mg total) by mouth daily. for cholesterol.   benazepril 20 MG tablet Commonly known as: LOTENSIN Take 1 tablet (20 mg total) by mouth daily. for blood pressure.   cefUROXime 500 MG tablet Commonly known as: CEFTIN Take 1 tablet (500 mg total) by mouth 2 (two) times daily with a meal for 7 days.   citalopram 20 MG tablet Commonly known as: CELEXA TAKE 1 TABLET(20 MG) BY MOUTH DAILY FOR ANXIETY   divalproex 250 MG 24 hr tablet Commonly known as: DEPAKOTE ER Take 1 tablet (250 mg total) by mouth daily.   divalproex 500 MG 24 hr tablet Commonly known as: DEPAKOTE ER Take 1 tablet (500 mg total) by mouth daily.   guaiFENesin-dextromethorphan 100-10 MG/5ML syrup Commonly known as: ROBITUSSIN  DM Take 5 mLs by mouth every 4 (four) hours as needed for cough.   hydrOXYzine 10 MG tablet Commonly known as: ATARAX Take 1-2 tablets (10-20 mg total) by mouth 2 (two) times daily as needed for anxiety. What changed:  how much to take when to take this   levothyroxine 125 MCG tablet Commonly known as: SYNTHROID TAKE ONE TABLET BY MOUTH EVERY MORNING ON AN EMPTY STOMACH WITH WATER ONLY.. NO FOOD OR OTHER MEDICATION FOR 30 MINUTES   memantine 10 MG tablet Commonly known as: NAMENDA Take 1 tablet (10 mg total) by mouth 2 (two) times daily.   metroNIDAZOLE 500 MG tablet Commonly known as: Flagyl Take 1 tablet (500 mg total) by mouth 2 (two) times daily for 7 days.   polyethylene glycol 17 g packet Commonly known as: MIRALAX / GLYCOLAX Take 17 g by mouth daily as needed for mild constipation.   traZODone 50 MG tablet Commonly known as: DESYREL TAKE 1 TABLET(50 MG) BY MOUTH AT BEDTIME FOR SLEEP               Durable Medical Equipment  (From admission, onward)           Start     Ordered   09/26/23 1202  For home use only DME 3 n 1  Once        09/26/23 1201   09/26/23 1201  For home use only DME standard manual wheelchair with seat cushion  Once       Comments: Patient suffers from weakness  which impairs their ability to perform daily activities like ambulate  in the home.  A cane  will not resolve issue with performing activities of daily living. A wheelchair will allow patient to safely perform daily activities. Patient can safely propel the wheelchair in the home or has a caregiver who can provide assistance. Length of need lifetime . Accessories: elevating leg rests (ELRs), wheel locks, extensions and anti-tippers.  Seat and back cushions   09/26/23 1201  Follow-up Information     [facility] Follow up.   Specialty: Home Health Services Contact information: [address] [place] [address] [phone]          Doreene Nest, NP Follow up in 1 week(s).   Specialty: Internal Medicine Contact information: [address] [place] [address] [phone]                  Time coordinating discharge: 39 minutes  Signed:  Nabria Nevin  [facility] 09/27/2023, 4:21 PM

## 2023-09-28 ENCOUNTER — Telehealth: Payer: Self-pay

## 2023-09-28 NOTE — Transitions of Care (Post Inpatient/ED Visit) (Signed)
09/28/2023  Name: Melanie Long MRN: [id number] DOB: September 08, 1950  Today's TOC FU Call Status: Today's TOC FU Call Status:: Successful TOC FU Call Completed TOC FU Call Complete Date: 09/28/23 Patient's Name and Date of Birth confirmed.  Transition Care Management Follow-up Telephone Call Date of Discharge: 09/27/23 Discharge Facility: [facility]) Type of Discharge: Inpatient Admission Primary Inpatient Discharge Diagnosis:: cholecystitis How have you been since you were released from the hospital?: Better Any questions or concerns?: No  Items Reviewed: Did you receive and understand the discharge instructions provided?: No Medications obtained,verified, and reconciled?: Yes (Medications Reviewed) Any new allergies since your discharge?: No Dietary orders reviewed?: Yes Do you have support at home?: Yes People in Home: spouse  Medications Reviewed Today: Medications Reviewed Today     Reviewed by Karena Addison, LPN (Licensed Practical Nurse) on 09/28/23 at 1018  Med List Status: <None>   Medication Order Taking? Sig Documenting Provider Last Dose Status Informant  aspirin 325 MG tablet [id number] No Take 325 mg by mouth daily as needed for moderate pain (pain score 4-6). [provider] 09/23/2023 Evening Active Spouse/Significant Other, Pharmacy Records  atorvastatin (LIPITOR) 10 MG tablet [id number] No Take 1 tablet (10 mg total) by mouth daily. for cholesterol. Doreene Nest, NP 09/23/2023 Morning Active Spouse/Significant Other, Pharmacy Records  benazepril (LOTENSIN) 20 MG tablet [id number] No Take 1 tablet (20 mg total) by mouth daily. for blood pressure. Doreene Nest, NP 09/23/2023 Morning Active Spouse/Significant Other, Pharmacy Records  cefUROXime (CEFTIN) 500 MG tablet [id number]  Take 1 tablet (500 mg total) by mouth 2 (two) times daily with a meal for 7 days. Pokhrel, Rebekah Chesterfield, MD  Active   citalopram (CELEXA) 20 MG tablet [id number] No TAKE 1 TABLET(20  MG) BY MOUTH DAILY FOR ANXIETY Doreene Nest, NP 09/23/2023 Morning Active Spouse/Significant Other, Pharmacy Records  divalproex (DEPAKOTE ER) 250 MG 24 hr tablet [id number] No Take 1 tablet (250 mg total) by mouth daily. Windell Norfolk, MD 09/23/2023 Morning Active Spouse/Significant Other, Pharmacy Records           Med Note (COFFELL, Marzella Schlein   Sat Sep 24, 2023 11:26 AM) Pt's husband verified that pt is taking 2 strengths of divalproex ER for TDD 750mg  QD  divalproex (DEPAKOTE ER) 500 MG 24 hr tablet [id number] No Take 1 tablet (500 mg total) by mouth daily. Windell Norfolk, MD 09/23/2023 Morning Active Spouse/Significant Other, Pharmacy Records  guaiFENesin-dextromethorphan [facility] DM) 100-10 MG/5ML syrup [id number]  Take 5 mLs by mouth every 4 (four) hours as needed for cough. Pokhrel, Laxman, MD  Active   hydrOXYzine (ATARAX) 10 MG tablet [id number] No Take 1-2 tablets (10-20 mg total) by mouth 2 (two) times daily as needed for anxiety.  Patient taking differently: Take 20 mg by mouth at bedtime.   Doreene Nest, NP 09/23/2023  7:00 PM Active Spouse/Significant Other, Pharmacy Records  levothyroxine (SYNTHROID) 125 MCG tablet [id number] No TAKE ONE TABLET BY MOUTH EVERY MORNING ON AN EMPTY STOMACH WITH WATER ONLY.. NO FOOD OR OTHER MEDICATION FOR 30 MINUTES Doreene Nest, NP 09/23/2023 Morning Active Spouse/Significant Other, Pharmacy Records  memantine (NAMENDA) 10 MG tablet [id number] No Take 1 tablet (10 mg total) by mouth 2 (two) times daily. Windell Norfolk, MD 09/23/2023  7:00 PM Active Spouse/Significant Other, Pharmacy Records  metroNIDAZOLE (FLAGYL) 500 MG tablet [id number]  Take 1 tablet (500 mg total) by mouth 2 (two) times daily for 7 days. Pokhrel, Rebekah Chesterfield, MD  Active   polyethylene glycol (MIRALAX /  GLYCOLAX) 17 g packet [id number]  Take 17 g by mouth daily as needed for mild constipation. Joycelyn Das, MD  Active   traZODone (DESYREL) 50 MG tablet [id number] No TAKE 1 TABLET(50 MG) BY  MOUTH AT BEDTIME FOR SLEEP Doreene Nest, NP 09/23/2023 Bedtime Active Spouse/Significant Other, Pharmacy Records            Home Care and Equipment/Supplies: Were Home Health Services Ordered?: NA Any new equipment or medical supplies ordered?: NA  Functional Questionnaire: Do you need assistance with bathing/showering or dressing?: Yes Do you need assistance with meal preparation?: Yes Do you have difficulty maintaining continence: No Do you need assistance with getting out of bed/getting out of a chair/moving?: No Do you have difficulty managing or taking your medications?: Yes  Follow up appointments reviewed: PCP Follow-up appointment confirmed?: Yes Date of PCP follow-up appointment?: 10/05/23 Follow-up Provider: [facility] Follow-up appointment confirmed?: NA Do you need transportation to your follow-up appointment?: No Do you understand care options if your condition(s) worsen?: Yes-patient verbalized understanding    SIGNATURE Karena Addison, LPN [facility] Nurse Health Advisor Direct Dial [phone]

## 2023-10-03 ENCOUNTER — Telehealth: Payer: Self-pay | Admitting: Primary Care

## 2023-10-03 NOTE — Telephone Encounter (Signed)
Copied from CRM [id number]. Topic: General - Other >> Oct 03, 2023 11:43 AM [place] A wrote: Reason for CRM: Eileen Stanford from [facility] called and said start of care will be delayed it will be 2/18 as request from patients daughter

## 2023-10-03 NOTE — Telephone Encounter (Signed)
 Noted

## 2023-10-04 ENCOUNTER — Ambulatory Visit: Payer: Self-pay | Admitting: Primary Care

## 2023-10-04 ENCOUNTER — Telehealth: Payer: Self-pay

## 2023-10-04 NOTE — Telephone Encounter (Signed)
Called and spoke to patients husband, moved appt to virtual tomorrow per their request.  Melanie Long.

## 2023-10-04 NOTE — Telephone Encounter (Signed)
 Noted
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
## 2023-10-04 NOTE — Telephone Encounter (Signed)
Copied from CRM [id number]. Topic: Clinical - Home Health Verbal Orders >> Oct 04, 2023 12:28 PM Adele Barthel wrote: Caller/Agency: [facility] Callback Number: [phone] Service Requested: Physical Therapy Frequency: Was unable to evaluate patient for PT referral, due to refusal by spouse for home health services. Wanted to notify provider.  Any new concerns about the patient? No

## 2023-10-05 ENCOUNTER — Inpatient Hospital Stay: Payer: Medicare Other | Admitting: Primary Care

## 2023-10-05 ENCOUNTER — Encounter: Payer: Self-pay | Admitting: Primary Care

## 2023-10-05 ENCOUNTER — Telehealth (INDEPENDENT_AMBULATORY_CARE_PROVIDER_SITE_OTHER): Payer: Medicare Other | Admitting: Primary Care

## 2023-10-05 DIAGNOSIS — F03918 Unspecified dementia, unspecified severity, with other behavioral disturbance: Secondary | ICD-10-CM

## 2023-10-05 DIAGNOSIS — F03B4 Unspecified dementia, moderate, with anxiety: Secondary | ICD-10-CM | POA: Diagnosis not present

## 2023-10-05 DIAGNOSIS — K810 Acute cholecystitis: Secondary | ICD-10-CM

## 2023-10-05 MED ORDER — LORAZEPAM 0.5 MG PO TABS: 0.5000 mg | ORAL_TABLET | Freq: Two times a day (BID) | ORAL | 0 refills | Status: DC | PRN

## 2023-10-05 NOTE — Patient Instructions (Signed)
You may increase the dose of trazodone to 100 mg at bedtime for sleep.  Take 2 of the 50 mg pills, let me know when these are running low.  You may take the lorazepam pill for moderate to severe agitation.  Start with 1/2 pill twice daily as needed.  Use this sparingly.  There may be an interaction with Depakote, please discuss this with the pharmacist.  Please also discuss continuing Depakote with the neurologist.  Schedule a follow-up visit in 1 week.  It was a pleasure to see you today!

## 2023-10-05 NOTE — Progress Notes (Signed)
Patient ID: Melanie Long, female    DOB: 12-09-1950, 73 y.o.   MRN: [id number]  Virtual visit completed through [employer]ility, a video enabled telemedicine application. Due to national recommendations of social distancing due to COVID-19, a virtual visit is felt to be most appropriate for this patient at this time. Reviewed limitations, risks, security and privacy concerns of performing a virtual visit and the availability of in person appointments. I also reviewed that there may be a patient responsible charge related to this service. The patient agreed to proceed.   Patient location: home Provider location: [place] at [facility], office Persons participating in this virtual visit: patient, provider, husband  If any vitals were documented, they were collected by patient at home unless specified below.    There were no vitals taken for this visit.   CC: Hospital Follow Up Subjective:   HPI: Melanie Long is a 73 y.o. female history of hypertension, dementia, hypothyroidism, hyperlipidemia, acute cholecystitis presenting on 10/05/2023 for Hospitalization Follow-up [facility] f/u/Patients husband states since being home from hospital dementia symptoms have increased and would like to discuss any other meds she can take)  Her husband joins [place] today who provides information for HPI.  She presented to [facility] ED on 09/23/2023 via EMS due to abdominal pain.  She is unable to provide reliable history, but has been reported acute abdominal pain without fevers, vomiting, diarrhea.  Prior history of diverticulitis requiring colectomy and colostomy status post takedown.  She underwent CT abdomen/pelvis which revealed acute cholecystitis, large abdominal wall hernias, constipation, questionable ileus versus SBO, new left ovarian mass.  She was admitted for further evaluation.  During her hospital stay general surgery consulted.  She underwent right upper quadrant abdominal ultrasound which revealed 1.6 cm  gallstone cholecystitis.  She was treated with antibiotics.  Surgical treatment was not recommended given her large hernias and prior abdominal surgeries.  There was discussion regarding a percutaneous cholecystectomy tube.  She underwent ultrasound of the pelvis which showed a left moderate hydrosalpinx, otherwise unremarkable ultrasound.   Her abdominal pain improved and she was able to tolerate liquids and food orally.  She was discharged home on 09/27/2023 with oral antibiotics.  Surgery and percutaneous cholecystectomy tube were deferred.  Since her hospital visit she completed her oral antibiotics and oral potassium. Her abdominal pain seems to have resolved according to her husband. She is ambulatory and her balance is nearly back to normal.  Her husband questions if she should go through with the laparoscopic cholecystectomy given risk for returning symptoms and infection.  Her dementia has worsened since her hospital stay.  Symptoms include calling out for her mother, hard to sit still, monitoring, tearfulness.  Her family has been giving her Trazodone and hydroxyzine HS historically, but since coming home her husband has had to give her hydroxyzine during the day.  Last night her husband gave her 2 trazodone tablets which helped.  The hydroxyzine is not helping at all now.  She's not had her Depakote since her hospital discharge as she could not swallow her pills. 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 Follows with neurology.        Relevant past medical, surgical, family and social history reviewed and updated as indicated. Interim medical history since our last visit reviewed. Allergies and medications reviewed and updated. Outpatient Medications Prior to Visit  Medication Sig Dispense Refill   atorvastatin (LIPITOR) 10 MG tablet Take 1 tablet (10 mg total) by mouth daily. for cholesterol. 90  tablet 2   benazepril (LOTENSIN) 20 MG tablet Take 1 tablet (20 mg total) by mouth daily. for blood  pressure. 90 tablet 3   citalopram (CELEXA) 20 MG tablet TAKE 1 TABLET(20 MG) BY MOUTH DAILY FOR ANXIETY 90 tablet 2   hydrOXYzine (ATARAX) 10 MG tablet Take 1-2 tablets (10-20 mg total) by mouth 2 (two) times daily as needed for anxiety. (Patient taking differently: Take 20 mg by mouth at bedtime.) 180 tablet 2   levothyroxine (SYNTHROID) 125 MCG tablet TAKE ONE TABLET BY MOUTH EVERY MORNING ON AN EMPTY STOMACH WITH WATER ONLY.. NO FOOD OR OTHER MEDICATION FOR 30 MINUTES 90 tablet 3   memantine (NAMENDA) 10 MG tablet Take 1 tablet (10 mg total) by mouth 2 (two) times daily. 180 tablet 3   traZODone (DESYREL) 50 MG tablet TAKE 1 TABLET(50 MG) BY MOUTH AT BEDTIME FOR SLEEP 90 tablet 2   aspirin 325 MG tablet Take 325 mg by mouth daily as needed for moderate pain (pain score 4-6). (Patient not taking: Reported on 10/05/2023)     divalproex (DEPAKOTE ER) 250 MG 24 hr tablet Take 1 tablet (250 mg total) by mouth daily. (Patient not taking: Reported on 10/05/2023) 90 tablet 3   divalproex (DEPAKOTE ER) 500 MG 24 hr tablet Take 1 tablet (500 mg total) by mouth daily. (Patient not taking: Reported on 10/05/2023) 90 tablet 3   guaiFENesin-dextromethorphan (ROBITUSSIN DM) 100-10 MG/5ML syrup Take 5 mLs by mouth every 4 (four) hours as needed for cough. (Patient not taking: Reported on 10/05/2023) 118 mL 0   polyethylene glycol (MIRALAX / GLYCOLAX) 17 g packet Take 17 g by mouth daily as needed for mild constipation. (Patient not taking: Reported on 10/05/2023) 14 each 0   No facility-administered medications prior to visit.     Per HPI unless specifically indicated in ROS section below Review of Systems  Constitutional:  Negative for fatigue.  Gastrointestinal:  Negative for abdominal pain.  Neurological:  Negative for dizziness.  Psychiatric/Behavioral:  Positive for agitation, behavioral problems and sleep disturbance. The patient is nervous/anxious.    Objective:  There were no vitals taken for this  visit.  Wt Readings from Last 3 Encounters:  09/27/23 219 lb 12.8 oz (99.7 kg)  05/17/23 208 lb (94.3 kg)  04/14/23 202 lb 8 oz (91.9 kg)       Physical exam: General: Alert but not participating in HPI.   Pulmonary: Non verbal during visit.   Psychiatric: Calm. No distress.      Results for orders placed or performed during the hospital encounter of 09/23/23  CBC with Differential   Collection Time: 09/23/23 10:58 PM  Result Value Ref Range   WBC 8.8 4.0 - 10.5 K/uL   RBC 4.69 3.87 - 5.11 MIL/uL   Hemoglobin 14.5 12.0 - 15.0 g/dL   HCT 09.8 11.9 - 14.7 %   MCV 92.1 80.0 - 100.0 fL   MCH 30.9 26.0 - 34.0 pg   MCHC 33.6 30.0 - 36.0 g/dL   RDW 82.9 56.2 - 13.0 %   Platelets 145 (L) 150 - 400 K/uL   nRBC 0.0 0.0 - 0.2 %   Neutrophils Relative % 66 %   Neutro Abs 5.8 1.7 - 7.7 K/uL   Lymphocytes Relative 22 %   Lymphs Abs 2.0 0.7 - 4.0 K/uL   Monocytes Relative 10 %   Monocytes Absolute 0.9 0.1 - 1.0 K/uL   Eosinophils Relative 0 %   Eosinophils Absolute 0.0 0.0 -  0.5 K/uL   Basophils Relative 0 %   Basophils Absolute 0.0 0.0 - 0.1 K/uL   Immature Granulocytes 2 %   Abs Immature Granulocytes 0.14 (H) 0.00 - 0.07 K/uL  Comprehensive metabolic panel   Collection Time: 09/23/23 10:58 PM  Result Value Ref Range   Sodium 138 135 - 145 mmol/L   Potassium 4.0 3.5 - 5.1 mmol/L   Chloride 103 98 - 111 mmol/L   CO2 21 (L) 22 - 32 mmol/L   Glucose, Bld 132 (H) 70 - 99 mg/dL   BUN 25 (H) 8 - 23 mg/dL   Creatinine, Ser 2.13 0.44 - 1.00 mg/dL   Calcium 8.9 8.9 - 08.6 mg/dL   Total Protein 6.7 6.5 - 8.1 g/dL   Albumin 3.4 (L) 3.5 - 5.0 g/dL   AST 18 15 - 41 U/L   ALT 14 0 - 44 U/L   Alkaline Phosphatase 53 38 - 126 U/L   Total Bilirubin 0.6 0.0 - 1.2 mg/dL   GFR, Estimated >57 >84 mL/min   Anion gap 14 5 - 15  Lipase, blood   Collection Time: 09/23/23 10:58 PM  Result Value Ref Range   Lipase 29 11 - 51 U/L  CK   Collection Time: 09/23/23 10:58 PM  Result Value Ref  Range   Total CK 47 38 - 234 U/L  Resp panel by RT-PCR (RSV, Flu A&B, Covid) Anterior Nasal Swab   Collection Time: 09/23/23 11:04 PM   Specimen: Anterior Nasal Swab  Result Value Ref Range   SARS Coronavirus 2 by RT PCR NEGATIVE NEGATIVE   Influenza A by PCR NEGATIVE NEGATIVE   Influenza B by PCR NEGATIVE NEGATIVE   Resp Syncytial Virus by PCR NEGATIVE NEGATIVE  Respiratory (~20 pathogens) panel by PCR   Collection Time: 09/23/23 11:04 PM   Specimen: Nasopharyngeal Swab; Respiratory  Result Value Ref Range   Adenovirus NOT DETECTED NOT DETECTED   Coronavirus 229E NOT DETECTED NOT DETECTED   Coronavirus HKU1 NOT DETECTED NOT DETECTED   Coronavirus NL63 NOT DETECTED NOT DETECTED   Coronavirus OC43 NOT DETECTED NOT DETECTED   Metapneumovirus NOT DETECTED NOT DETECTED   Rhinovirus / Enterovirus NOT DETECTED NOT DETECTED   Influenza A NOT DETECTED NOT DETECTED   Influenza B NOT DETECTED NOT DETECTED   Parainfluenza Virus 1 NOT DETECTED NOT DETECTED   Parainfluenza Virus 2 NOT DETECTED NOT DETECTED   Parainfluenza Virus 3 NOT DETECTED NOT DETECTED   Parainfluenza Virus 4 NOT DETECTED NOT DETECTED   Respiratory Syncytial Virus NOT DETECTED NOT DETECTED   Bordetella pertussis NOT DETECTED NOT DETECTED   Bordetella Parapertussis NOT DETECTED NOT DETECTED   Chlamydophila pneumoniae NOT DETECTED NOT DETECTED   Mycoplasma pneumoniae NOT DETECTED NOT DETECTED  Urine Culture   Collection Time: 09/24/23 12:44 AM   Specimen: Urine, Clean Catch  Result Value Ref Range   Specimen Description URINE, CLEAN CATCH    Special Requests NONE    Culture (A)     30,000 COLONIES/mL STAPHYLOCOCCUS HAEMOLYTICUS 70,000 COLONIES/mL DIPHTHEROIDS(CORYNEBACTERIUM SPECIES) Standardized susceptibility testing for this organism is not available. Performed at [facility], [address], [place], [place] [address]    Report Status 09/26/2023 FINAL    Organism ID, Bacteria STAPHYLOCOCCUS HAEMOLYTICUS  (A)       Susceptibility   Staphylococcus haemolyticus - MIC*    CIPROFLOXACIN <=0.5 SENSITIVE Sensitive     GENTAMICIN <=0.5 SENSITIVE Sensitive     NITROFURANTOIN <=16 SENSITIVE Sensitive  OXACILLIN <=0.25 SENSITIVE Sensitive     TETRACYCLINE <=1 SENSITIVE Sensitive     VANCOMYCIN <=0.5 SENSITIVE Sensitive     TRIMETH/SULFA <=10 SENSITIVE Sensitive     RIFAMPIN <=0.5 SENSITIVE Sensitive     Inducible Clindamycin NEGATIVE Sensitive     * 30,000 COLONIES/mL STAPHYLOCOCCUS HAEMOLYTICUS  Urinalysis, Routine w reflex microscopic -Urine, Clean Catch   Collection Time: 09/24/23 12:44 AM  Result Value Ref Range   Color, Urine YELLOW YELLOW   APPearance CLEAR CLEAR   Specific Gravity, Urine 1.015 1.005 - 1.030   pH 7.0 5.0 - 8.0   Glucose, UA NEGATIVE NEGATIVE mg/dL   Hgb urine dipstick MODERATE (A) NEGATIVE   Bilirubin Urine NEGATIVE NEGATIVE   Ketones, ur NEGATIVE NEGATIVE mg/dL   Protein, ur NEGATIVE NEGATIVE mg/dL   Nitrite NEGATIVE NEGATIVE   Leukocytes,Ua TRACE (A) NEGATIVE   RBC / HPF 11-20 0 - 5 RBC/hpf   WBC, UA 11-20 0 - 5 WBC/hpf   Bacteria, UA NONE SEEN NONE SEEN   Squamous Epithelial / HPF 0-5 0 - 5 /HPF   Mucus PRESENT   Comprehensive metabolic panel   Collection Time: 09/25/23  5:13 AM  Result Value Ref Range   Sodium 138 135 - 145 mmol/L   Potassium 3.6 3.5 - 5.1 mmol/L   Chloride 103 98 - 111 mmol/L   CO2 24 22 - 32 mmol/L   Glucose, Bld 113 (H) 70 - 99 mg/dL   BUN 15 8 - 23 mg/dL   Creatinine, Ser 2.95 0.44 - 1.00 mg/dL   Calcium 8.4 (L) 8.9 - 10.3 mg/dL   Total Protein 6.2 (L) 6.5 - 8.1 g/dL   Albumin 2.9 (L) 3.5 - 5.0 g/dL   AST 21 15 - 41 U/L   ALT 12 0 - 44 U/L   Alkaline Phosphatase 50 38 - 126 U/L   Total Bilirubin 0.6 0.0 - 1.2 mg/dL   GFR, Estimated >62 >13 mL/min   Anion gap 11 5 - 15  CBC   Collection Time: 09/25/23  5:13 AM  Result Value Ref Range   WBC 17.1 (H) 4.0 - 10.5 K/uL   RBC 4.89 3.87 - 5.11 MIL/uL   Hemoglobin 14.9 12.0 - 15.0  g/dL   HCT 08.6 57.8 - 46.9 %   MCV 91.0 80.0 - 100.0 fL   MCH 30.5 26.0 - 34.0 pg   MCHC 33.5 30.0 - 36.0 g/dL   RDW 62.9 52.8 - 41.3 %   Platelets 126 (L) 150 - 400 K/uL   nRBC 0.0 0.0 - 0.2 %  CBC   Collection Time: 09/26/23  6:03 AM  Result Value Ref Range   WBC 12.2 (H) 4.0 - 10.5 K/uL   RBC 4.32 3.87 - 5.11 MIL/uL   Hemoglobin 13.5 12.0 - 15.0 g/dL   HCT 24.4 01.0 - 27.2 %   MCV 90.7 80.0 - 100.0 fL   MCH 31.3 26.0 - 34.0 pg   MCHC 34.4 30.0 - 36.0 g/dL   RDW 53.6 64.4 - 03.4 %   Platelets 120 (L) 150 - 400 K/uL   nRBC 0.0 0.0 - 0.2 %  Magnesium   Collection Time: 09/26/23  6:03 AM  Result Value Ref Range   Magnesium 1.9 1.7 - 2.4 mg/dL  Comprehensive metabolic panel   Collection Time: 09/26/23  6:03 AM  Result Value Ref Range   Sodium 137 135 - 145 mmol/L   Potassium 3.1 (L) 3.5 - 5.1 mmol/L   Chloride 102 98 -  111 mmol/L   CO2 26 22 - 32 mmol/L   Glucose, Bld 93 70 - 99 mg/dL   BUN 18 8 - 23 mg/dL   Creatinine, Ser 1.47 0.44 - 1.00 mg/dL   Calcium 7.9 (L) 8.9 - 10.3 mg/dL   Total Protein 5.8 (L) 6.5 - 8.1 g/dL   Albumin 2.4 (L) 3.5 - 5.0 g/dL   AST 18 15 - 41 U/L   ALT 12 0 - 44 U/L   Alkaline Phosphatase 46 38 - 126 U/L   Total Bilirubin 0.5 0.0 - 1.2 mg/dL   GFR, Estimated >82 >95 mL/min   Anion gap 9 5 - 15  Comprehensive metabolic panel   Collection Time: 09/27/23  6:01 AM  Result Value Ref Range   Sodium 135 135 - 145 mmol/L   Potassium 2.9 (L) 3.5 - 5.1 mmol/L   Chloride 100 98 - 111 mmol/L   CO2 23 22 - 32 mmol/L   Glucose, Bld 95 70 - 99 mg/dL   BUN 12 8 - 23 mg/dL   Creatinine, Ser 6.21 0.44 - 1.00 mg/dL   Calcium 7.6 (L) 8.9 - 10.3 mg/dL   Total Protein 5.6 (L) 6.5 - 8.1 g/dL   Albumin 2.4 (L) 3.5 - 5.0 g/dL   AST 20 15 - 41 U/L   ALT 14 0 - 44 U/L   Alkaline Phosphatase 48 38 - 126 U/L   Total Bilirubin 0.5 0.0 - 1.2 mg/dL   GFR, Estimated >30 >86 mL/min   Anion gap 12 5 - 15  CBC   Collection Time: 09/27/23  6:01 AM  Result Value  Ref Range   WBC 7.9 4.0 - 10.5 K/uL   RBC 4.14 3.87 - 5.11 MIL/uL   Hemoglobin 12.7 12.0 - 15.0 g/dL   HCT 57.8 46.9 - 62.9 %   MCV 92.0 80.0 - 100.0 fL   MCH 30.7 26.0 - 34.0 pg   MCHC 33.3 30.0 - 36.0 g/dL   RDW 52.8 41.3 - 24.4 %   Platelets 102 (L) 150 - 400 K/uL   nRBC 0.0 0.0 - 0.2 %   Assessment & Plan:   Problem List Items Addressed This Visit       Digestive   Acute cholecystitis   With recent hospitalization. Seems improved and stable.  Hospital labs, imaging, notes reviewed. Repeat labs next week.  We discussed the risk for surgical intervention with her existing hernias and prior abdominal surgeries.  We discussed the potential referral for a second opinion.  Her husband will think about this.  Follow-up in 1 week.         Nervous and Auditory   Dementia (HCC) - Primary   Deteriorated with recent hospitalization.  Increase trazodone to 100 mg at bedtime. Continue citalopram 20 mg daily. Continue Namenda 10 mg twice daily.  Continue hydroxyzine 10 to 20 mg as needed. Start lorazepam 0.25 to 0.5 tablets twice daily as needed.  Instructions provided to use sparingly.  Close follow-up in 1 week.      Relevant Medications   LORazepam (ATIVAN) 0.5 MG tablet   Other Visit Diagnoses       Dementia with behavioral disturbance (HCC)       Relevant Medications   LORazepam (ATIVAN) 0.5 MG tablet        Meds ordered this encounter  Medications   LORazepam (ATIVAN) 0.5 MG tablet    Sig: Take 1 tablet (0.5 mg total) by mouth 2 (two) times daily as needed  for anxiety.    Dispense:  30 tablet    Refill:  0    Supervising Provider:   BEDSOLE, AMY E [[id number]]   No orders of the defined types were placed in this encounter.   I discussed the assessment and treatment plan with the patient. The patient was provided an opportunity to ask questions and all were answered. The patient agreed with the plan and demonstrated an understanding of the instructions. The  patient was advised to call back or seek an in-person evaluation if the symptoms worsen or if the condition fails to improve as anticipated.  Follow up plan:  You may increase the dose of trazodone to 100 mg at bedtime for sleep.  Take 2 of the 50 mg pills, let me know when these are running low.  You may take the lorazepam pill for moderate to severe agitation.  Start with 1/2 pill twice daily as needed.  Use this sparingly.  There may be an interaction with Depakote, please discuss this with the pharmacist.  Please also discuss continuing Depakote with the neurologist.  Schedule a follow-up visit in 1 week.  It was a pleasure to see you today!   Doreene Nest, NP

## 2023-10-05 NOTE — Assessment & Plan Note (Signed)
With recent hospitalization. Seems improved and stable.  Hospital labs, imaging, notes reviewed. Repeat labs next week.  We discussed the risk for surgical intervention with her existing hernias and prior abdominal surgeries.  We discussed the potential referral for a second opinion.  Her husband will think about this.  Follow-up in 1 week.

## 2023-10-05 NOTE — Assessment & Plan Note (Signed)
Deteriorated with recent hospitalization.  Increase trazodone to 100 mg at bedtime. Continue citalopram 20 mg daily. Continue Namenda 10 mg twice daily.  Continue hydroxyzine 10 to 20 mg as needed. Start lorazepam 0.25 to 0.5 tablets twice daily as needed.  Instructions provided to use sparingly.  Close follow-up in 1 week.

## 2023-10-06 ENCOUNTER — Ambulatory Visit: Payer: Medicare Other | Admitting: Primary Care

## 2023-10-11 ENCOUNTER — Encounter: Payer: Self-pay | Admitting: Primary Care

## 2023-10-11 ENCOUNTER — Ambulatory Visit (INDEPENDENT_AMBULATORY_CARE_PROVIDER_SITE_OTHER): Payer: Medicare Other | Admitting: Primary Care

## 2023-10-11 VITALS — BP 136/72 | HR 76 | Temp 98.1°F

## 2023-10-11 DIAGNOSIS — E890 Postprocedural hypothyroidism: Secondary | ICD-10-CM

## 2023-10-11 DIAGNOSIS — F03918 Unspecified dementia, unspecified severity, with other behavioral disturbance: Secondary | ICD-10-CM | POA: Diagnosis not present

## 2023-10-11 DIAGNOSIS — E782 Mixed hyperlipidemia: Secondary | ICD-10-CM

## 2023-10-11 DIAGNOSIS — I10 Essential (primary) hypertension: Secondary | ICD-10-CM | POA: Diagnosis not present

## 2023-10-11 DIAGNOSIS — F03B4 Unspecified dementia, moderate, with anxiety: Secondary | ICD-10-CM

## 2023-10-11 LAB — LIPID PANEL
Cholesterol: 208 mg/dL — ABNORMAL HIGH (ref 0–200)
HDL: 45.7 mg/dL (ref >39.00)
LDL Cholesterol: 123 mg/dL — ABNORMAL HIGH (ref 0–99)
NonHDL: 162.2
Total CHOL/HDL Ratio: 5
Triglycerides: 198 mg/dL — ABNORMAL HIGH (ref 0.0–149.0)
VLDL: 39.6 mg/dL (ref 0.0–40.0)

## 2023-10-11 LAB — COMPREHENSIVE METABOLIC PANEL
ALT: 15 U/L (ref 0–35)
AST: 19 U/L (ref 0–37)
Albumin: 3.6 g/dL (ref 3.5–5.2)
Alkaline Phosphatase: 58 U/L (ref 39–117)
BUN: 14 mg/dL (ref 6–23)
CO2: 28 meq/L (ref 19–32)
Calcium: 8.7 mg/dL (ref 8.4–10.5)
Chloride: 103 meq/L (ref 96–112)
Creatinine, Ser: 0.84 mg/dL (ref 0.40–1.20)
GFR: 70.21 mL/min (ref >60.00)
Glucose, Bld: 82 mg/dL (ref 70–99)
Potassium: 4.6 meq/L (ref 3.5–5.1)
Sodium: 139 meq/L (ref 135–145)
Total Bilirubin: 0.6 mg/dL (ref 0.2–1.2)
Total Protein: 6.6 g/dL (ref 6.0–8.3)

## 2023-10-11 LAB — CBC
HCT: 39.1 % (ref 36.0–46.0)
Hemoglobin: 13 g/dL (ref 12.0–15.0)
MCHC: 33.3 g/dL (ref 30.0–36.0)
MCV: 92 fL (ref 78.0–100.0)
Platelets: 236 10*3/uL (ref 150.0–400.0)
RBC: 4.25 Mil/uL (ref 3.87–5.11)
RDW: 14.8 % (ref 11.5–15.5)
WBC: 5.8 10*3/uL (ref 4.0–10.5)

## 2023-10-11 LAB — TSH: TSH: 4.88 u[IU]/mL (ref 0.35–5.50)

## 2023-10-11 MED ORDER — QUETIAPINE FUMARATE 25 MG PO TABS
25.0000 mg | ORAL_TABLET | Freq: Every day | ORAL | 0 refills | Status: DC
Start: 2023-10-11 — End: 2023-11-28

## 2023-10-11 NOTE — Addendum Note (Signed)
 Addended by: Doreene Nest on: 10/11/2023 01:08 PM   Modules accepted: Orders

## 2023-10-11 NOTE — Assessment & Plan Note (Addendum)
 Progressing.  Slight improvement with lorazepam, but no significant treatment.  Continue Namenda 10 mg twice daily as this seems to be effective for hallucinations. Start Seroquel 25 mg at bedtime for agitation/anxiety/sleep. Hold trazodone 50 mg for now.  Wean off of citalopram as instructed due to drug interaction with Seroquel.  Continue hydroxyzine 10 to 20 mg twice daily as needed and lorazepam 0.5 mg as needed  Family will update.

## 2023-10-11 NOTE — Assessment & Plan Note (Signed)
 Repeat TSH pending. Continue levothyroxine 125 mcg daily.

## 2023-10-11 NOTE — Assessment & Plan Note (Signed)
 Continue atorvastatin 10 milligrams daily. Repeat lipid panel pending.

## 2023-10-11 NOTE — Patient Instructions (Addendum)
 Will need to wean off of citalopram in order to start Seroquel.  Reduce citalopram to 1/2 tablet daily x 1 week, then stop.  Start Seroquel 25 mg at bedtime for sleep/agitation.  Do not take trazodone for sleep. Avoid hydroxyzine at bedtime for sleep.  Continue with memantine 10 mg twice daily, lorazepam and hydroxyzine as needed during the day.  Stop by the lab prior to leaving today. I will notify you of your results once received.   It was a pleasure to see you today!

## 2023-10-11 NOTE — Progress Notes (Addendum)
 Subjective:    Patient ID: Melanie Long, female    DOB: [birth year], 73 y.o.   MRN: [id number]  HPI  Melanie Long is a very pleasant 73 y.o. female with a history of hypertension, hypothyroidism, dementia, hyperlipidemia who presents today for follow-up of abdominal pain and progressing dementia.  Her husband and daughter joined [place] to provide information for HPI.  She was last evaluated virtually with her family for hospital follow-up acute cholecystitis.  During this visit her mental symptoms had resolved but her dementia symptoms had worsened.  She had been off her Depakote since her hospital discharge.  Her regular regimen of citalopram 20 mg daily, memantine 10 mg twice daily, hydroxyzine 10 to 20 mg as needed was ineffective for anxiety and agitation.  Given this, we initiated lorazepam to use as needed.  She is here for follow-up today.  Since her virtual visit her abdominal pain remains resolved. He has not had a seizure or seizure activity. Family has not provided her with Depakote since discharge.   Her dementia continues to progress. She will have episodes of panic, calling out for her mother for hours, tearfulness, restlessness during the night, not sleeping. Her symptoms occur multiple times daily. Her husband is giving hydroxyzine 20 mg in AM, lorazepam 0.5 mg (2-3 tablets) during the day, hydroxyzine 20 mg HS, trazodone 50 mg at bedtime with only temporary improvement.    She is still taking her citalopram 20 mg daily and Namenda 10 mg twice daily.  Her husband mentions that the Namenda helps with hallucinations.  The citalopram wants help with anxiety but does not appear effective any longer.  She's not walking as much.  Her appetite is good.  Her husband and daughter are requesting home health care to help with ADLs and caring for the patient.  BP Readings from Last 3 Encounters:  10/11/23 136/72  09/27/23 122/62  05/17/23 128/62      Review of Systems   Constitutional:  Negative for fever.  Gastrointestinal:  Negative for abdominal pain.  Psychiatric/Behavioral:  Positive for agitation, confusion and sleep disturbance. The patient is nervous/anxious.          Past Medical History:  Diagnosis Date   Acute cholecystitis 09/24/2023   Anxiety    Dementia (HCC)    Depression    Diverticulitis    Essential hypertension    Hallucinations    High cholesterol    Hypothyroidism    Osteoarthritis     Social History   Socioeconomic History   Marital status: Married    Spouse name: Not on file   Number of children: 1   Years of education: College   Highest education level: Not on file  Occupational History   Not on file  Tobacco Use   Smoking status: Never   Smokeless tobacco: Never  Substance and Sexual Activity   Alcohol use: Not Currently   Drug use: Never   Sexual activity: Not on file  Other Topics Concern   Not on file  Social History Narrative   Patient is retired.   Lives at home with her husband.   Drinks 3 cups of coffee daily.   Social Drivers of Corporate investment banker Strain: Low Risk  (10/12/2022)   Overall Financial Resource Strain (CARDIA)    Difficulty of Paying Living Expenses: Not hard at all  Food Insecurity: No Food Insecurity (09/24/2023)   Hunger Vital Sign    Worried About Running Out of Food in the Last  Year: Never true    Ran Out of Food in the Last Year: Never true  Transportation Needs: Patient Unable To Answer (09/24/2023)   PRAPARE - Transportation    Lack of Transportation (Medical): Patient unable to answer    Lack of Transportation (Non-Medical): Patient unable to answer  Physical Activity: Inactive (10/12/2022)   Exercise Vital Sign    Days of Exercise per Week: 0 days    Minutes of Exercise per Session: 0 min  Stress: No Stress Concern Present (10/12/2022)   [employer] of Occupational Health - Occupational Stress Questionnaire    Feeling of Stress : Not at all  Social  Connections: Unknown (09/24/2023)   Social Connection and Isolation Panel [NHANES]    Frequency of Communication with Friends and Family: Patient unable to answer    Frequency of Social Gatherings with Friends and Family: Patient unable to answer    Attends Religious Services: Patient unable to answer    Active Member of Clubs or Organizations: Patient unable to answer    Attends Banker Meetings: Patient unable to answer    Marital Status: Living with partner  Intimate Partner Violence: Patient Unable To Answer (09/24/2023)   Humiliation, Afraid, Rape, and Kick questionnaire    Fear of Current or Ex-Partner: Patient unable to answer    Emotionally Abused: Patient unable to answer    Physically Abused: Patient unable to answer    Sexually Abused: Patient unable to answer    Past Surgical History:  Procedure Laterality Date   BREAST BIOPSY Right 12/27/2019   distortion, ribbon, discordant   COLON SURGERY     Secondary to acute diverticulitis    COLONOSCOPY     COLONOSCOPY WITH PROPOFOL N/A 07/16/2021   Procedure: COLONOSCOPY WITH PROPOFOL;  Surgeon: Toney Reil, MD;  Location: [facility] ENDOSCOPY;  Service: Gastroenterology;  Laterality: N/A;  PATIENT HAS DEMENTIA  HUSBAND IS POA - TO COME TO ENDO WITH PATIENT   THYROIDECTOMY      Family History  Problem Relation Age of Onset   Arthritis Mother    Diabetes Mother    Breast cancer Mother        29's   Diabetes Father    Heart attack Father     Allergies  Allergen Reactions   Ciprofloxacin Other (See Comments)    Unknown reaction   Elemental Sulfur Other (See Comments)    Unknown reaction   Penicillins Other (See Comments)    Unknown reaction   Sulfa Antibiotics Other (See Comments)    Unknown reaction   Tetanus-Diphth-Acell Pertussis Other (See Comments)    Unknown reaction    Current Outpatient Medications on File Prior to Visit  Medication Sig Dispense Refill   atorvastatin (LIPITOR) 10 MG tablet  Take 1 tablet (10 mg total) by mouth daily. for cholesterol. 90 tablet 2   benazepril (LOTENSIN) 20 MG tablet Take 1 tablet (20 mg total) by mouth daily. for blood pressure. 90 tablet 3   hydrOXYzine (ATARAX) 10 MG tablet Take 1-2 tablets (10-20 mg total) by mouth 2 (two) times daily as needed for anxiety. (Patient taking differently: Take 20 mg by mouth at bedtime.) 180 tablet 2   levothyroxine (SYNTHROID) 125 MCG tablet TAKE ONE TABLET BY MOUTH EVERY MORNING ON AN EMPTY STOMACH WITH WATER ONLY.. NO FOOD OR OTHER MEDICATION FOR 30 MINUTES 90 tablet 3   LORazepam (ATIVAN) 0.5 MG tablet Take 1 tablet (0.5 mg total) by mouth 2 (two) times daily as needed for anxiety.  30 tablet 0   memantine (NAMENDA) 10 MG tablet Take 1 tablet (10 mg total) by mouth 2 (two) times daily. 180 tablet 3   traZODone (DESYREL) 50 MG tablet TAKE 1 TABLET(50 MG) BY MOUTH AT BEDTIME FOR SLEEP 90 tablet 2   divalproex (DEPAKOTE ER) 250 MG 24 hr tablet Take 1 tablet (250 mg total) by mouth daily. (Patient not taking: Reported on 10/05/2023) 90 tablet 3   divalproex (DEPAKOTE ER) 500 MG 24 hr tablet Take 1 tablet (500 mg total) by mouth daily. (Patient not taking: Reported on 10/05/2023) 90 tablet 3   polyethylene glycol (MIRALAX / GLYCOLAX) 17 g packet Take 17 g by mouth daily as needed for mild constipation. (Patient not taking: Reported on 10/11/2023) 14 each 0   No current facility-administered medications on file prior to visit.    BP 136/72   Pulse 76   Temp 98.1 F (36.7 C) (Temporal)   SpO2 96%  Objective:   Physical Exam Cardiovascular:     Rate and Rhythm: Normal rate and regular rhythm.  Pulmonary:     Effort: Pulmonary effort is normal.     Breath sounds: Normal breath sounds.  Abdominal:     General: Bowel sounds are normal.     Palpations: Abdomen is soft.     Tenderness: There is no abdominal tenderness.  Musculoskeletal:     Cervical back: Neck supple.  Skin:    General: Skin is warm and dry.   Neurological:     Mental Status: She is alert.     Comments: Does not participate in HPI  Psychiatric:     Comments: Calm, no distress           Assessment & Plan:  Dementia with behavioral disturbance (HCC) -     QUEtiapine Fumarate; Take 1 tablet (25 mg total) by mouth at bedtime. For agitation and sleep  Dispense: 90 tablet; Refill: 0 -     Ambulatory referral to Home Health  Mixed hyperlipidemia Assessment & Plan: Continue atorvastatin 10 milligrams daily. Repeat lipid panel pending.  Orders: -     Lipid panel  Postoperative hypothyroidism Assessment & Plan: Repeat TSH pending. Continue levothyroxine 125 mcg daily.  Orders: -     TSH  Essential hypertension Assessment & Plan: Controlled.  Continue benazepril 20 mg daily. CMP pending.  Orders: -     CBC -     Comprehensive metabolic panel  Moderate dementia with anxiety, unspecified dementia type (HCC) Assessment & Plan: Progressing.  Slight improvement with lorazepam, but no significant treatment.  Continue Namenda 10 mg twice daily as this seems to be effective for hallucinations. Start Seroquel 25 mg at bedtime for agitation/anxiety/sleep. Hold trazodone 50 mg for now.  Wean off of citalopram as instructed due to drug interaction with Seroquel.  Continue hydroxyzine 10 to 20 mg twice daily as needed and lorazepam 0.5 mg as needed  Family will update.  Orders: -     Ambulatory referral to Home Health        Doreene Nest, NP

## 2023-10-11 NOTE — Assessment & Plan Note (Signed)
 Controlled.  Continue benazepril 20 mg daily.  CMP pending.

## 2023-11-02 ENCOUNTER — Telehealth: Payer: Self-pay | Admitting: Primary Care

## 2023-11-02 NOTE — Telephone Encounter (Signed)
 I was discussing referrals with the patient spouse and he has requested that someone give him a call to discuss the patients seizure medication. She is having trouble swallowing pills and they are needing the meds in another form.    He states the Neurology was filling the prescription but they are no longer taking her to the Neurologist so he was hoping that Jae Dire could take over prescribing the medication.    Please advise, thanks.

## 2023-11-07 ENCOUNTER — Ambulatory Visit: Payer: Self-pay

## 2023-11-07 ENCOUNTER — Ambulatory Visit (INDEPENDENT_AMBULATORY_CARE_PROVIDER_SITE_OTHER): Admitting: Nurse Practitioner

## 2023-11-07 VITALS — BP 108/82 | HR 100 | Temp 97.6°F | Ht 67.0 in | Wt 215.4 lb

## 2023-11-07 DIAGNOSIS — R8290 Unspecified abnormal findings in urine: Secondary | ICD-10-CM | POA: Insufficient documentation

## 2023-11-07 DIAGNOSIS — N3090 Cystitis, unspecified without hematuria: Secondary | ICD-10-CM

## 2023-11-07 DIAGNOSIS — R350 Frequency of micturition: Secondary | ICD-10-CM | POA: Insufficient documentation

## 2023-11-07 HISTORY — DX: Cystitis, unspecified without hematuria: N30.90

## 2023-11-07 LAB — POC URINALSYSI DIPSTICK (AUTOMATED)
Bilirubin, UA: NEGATIVE
Blood, UA: POSITIVE
Glucose, UA: NEGATIVE
Ketones, UA: NEGATIVE
Nitrite, UA: NEGATIVE
Protein, UA: POSITIVE — AB
Spec Grav, UA: 1.01 (ref 1.010–1.025)
Urobilinogen, UA: NEGATIVE U/dL — AB
pH, UA: 7 (ref 5.0–8.0)

## 2023-11-07 MED ORDER — CEPHALEXIN 500 MG PO CAPS: 500.0000 mg | ORAL_CAPSULE | Freq: Two times a day (BID) | ORAL | 0 refills | Status: DC

## 2023-11-07 NOTE — Progress Notes (Signed)
 Acute Office Visit  Subjective:     Patient ID: Melanie Long, female    DOB: 19-Aug-1950, 73 y.o.   MRN: [id number]  Chief Complaint  Patient presents with   Urinary Tract Infection    Pt complains of urinary frequency and pressure on Friday. Pt took a at home AVO uti test. Came back positive.      Patient is in today for urinary complaints with a history of dementia, hypothyroidism, HTN, OA, and HLD  Spouse was at the bedside and gave history   Symptoms stared on Friday with frequency and pressure. They took an at home UA and it came back positive for a UTI and they came to be evaluated states that it has been an extended period of time since the last one. She has had some urgency with incontinence. She does have a history of dementia. States that the husband states that she has been at baseline but last night she got agitated and wanted to leave the house.     Review of Systems  Constitutional:  Negative for chills and fever.  Respiratory:  Negative for shortness of breath.   Cardiovascular:  Negative for chest pain.  Gastrointestinal:  Negative for abdominal pain, nausea and vomiting.  Genitourinary:  Positive for dysuria, frequency and urgency. Negative for hematuria.  Musculoskeletal:  Negative for back pain.        Objective:    BP 108/82   Pulse 100   Temp 97.6 F (36.4 C) (Oral)   Ht 5\' 7"  (1.702 m)   Wt 215 lb 6.4 oz (97.7 kg)   SpO2 96%   BMI 33.74 kg/m  BP Readings from Last 3 Encounters:  11/07/23 108/82  10/11/23 136/72  09/27/23 122/62   Wt Readings from Last 3 Encounters:  11/07/23 215 lb 6.4 oz (97.7 kg)  09/27/23 219 lb 12.8 oz (99.7 kg)  05/17/23 208 lb (94.3 kg)   SpO2 Readings from Last 3 Encounters:  11/07/23 96%  10/11/23 96%  09/27/23 99%      Physical Exam Vitals and nursing note reviewed.  Constitutional:      Appearance: Normal appearance.  Cardiovascular:     Rate and Rhythm: Normal rate and regular rhythm.     Heart  sounds: Normal heart sounds.  Pulmonary:     Effort: Pulmonary effort is normal.     Breath sounds: Normal breath sounds.  Abdominal:     General: Bowel sounds are normal. There is no distension.     Palpations: There is no mass.     Tenderness: There is no abdominal tenderness. There is no right CVA tenderness or left CVA tenderness.     Hernia: No hernia is present.  Neurological:     Mental Status: She is alert.     No results found for any visits on 11/07/23.      Assessment & Plan:   Problem List Items Addressed This Visit       Genitourinary   Cystitis   UA indicative of urinary tract infection.  Will treat patient with Keflex 500 mg twice daily for 7 days.  Thanks for historian patient spouse states that she started Keflex before I do see that she was given ceftriaxone in the past.  Penicillin allergy and sulfa allergy or from childhood and unsure if there is a true allergy.  Pending urine culture      Relevant Medications   cephALEXin (KEFLEX) 500 MG capsule     Other  Urinary frequency - Primary   UA in office       Relevant Orders   POCT Urinalysis Dipstick (Automated)   Abnormal urinalysis   Pending Urine culture. Treat empirically with keflex 500mg  BID for 7 days       Relevant Orders   Urine Culture    Meds ordered this encounter  Medications   cephALEXin (KEFLEX) 500 MG capsule    Sig: Take 1 capsule (500 mg total) by mouth 2 (two) times daily.    Dispense:  14 capsule    Refill:  0    Supervising Provider:   Roxy Manns A [[id number]]    Return if symptoms worsen or fail to improve.  Audria Nine, NP

## 2023-11-07 NOTE — Telephone Encounter (Signed)
 My recommendation would be to call the neurologist's office to learn if she needs to continue taking the Depakote and if they recommend a liquid form. My understanding is that she's not had Depakote since early February 2025.

## 2023-11-07 NOTE — Patient Instructions (Signed)
 Nice to see you today I have sent in antibiotics to the pharmacy  Follow up if she does not improve  I will be in touch with the urine culture once it results

## 2023-11-07 NOTE — Telephone Encounter (Signed)
 Noted. Will evaluate in office

## 2023-11-07 NOTE — Telephone Encounter (Signed)
Unable to reach patients spouse. Left voicemail to return call to our office.

## 2023-11-07 NOTE — Telephone Encounter (Signed)
 Noted and appreciate Matt, NP evaluation.

## 2023-11-07 NOTE — Telephone Encounter (Signed)
  Chief Complaint: urinary symptoms Symptoms: increased frequency, confusion Frequency: 4 days Pertinent Negatives: Patient denies fever, blood in urine Disposition: [] ED /[] Urgent Care (no appt availability in office) / [x] Appointment(In office/virtual)/ []  [facility]/ [] Home Care/ [] Refused Recommended Disposition /[]  Mobile Bus/ []  Follow-up with PCP Additional Notes: Patient husband Barrett on DPR states patient has had increased urinary frequency x4 days with some increased agitation/confusion. States she took an at home test that was positive for UTI. Per protocol, patient to be evaluated within 24 hours. First available appointment with PCP outside of guideline. Patient scheduled with first available provider in clinic for today at 1520. Care advice reviewed, caller verbalized understanding and denies further questions at this time. Alerting PCP for review.    Reason for Disposition  Urinating more frequently than usual (i.e., frequency)  Answer Assessment - Initial Assessment Questions 1. SYMPTOM: "What's the main symptom you're concerned about?" (e.g., frequency, incontinence)     Increased frequency 2. ONSET: "When did the  symptoms  start?"     4 days 3. PAIN: "Is there any pain?" If Yes, ask: "How bad is it?" (Scale: 1-10; mild, moderate, severe)     Discomfort, not complaining of pain 4. CAUSE: "What do you think is causing the symptoms?"     UTI 5. OTHER SYMPTOMS: "Do you have any other symptoms?" (e.g., blood in urine, fever, flank pain, pain with urination)     Increased frequency, increased confusion  Protocols used: Urinary Symptoms-A-AH

## 2023-11-07 NOTE — Telephone Encounter (Signed)
 Copied from CRM [id number]. Topic: General - Other >> Nov 07, 2023  2:42 PM Fredrich Romans wrote: Reason for CRM: Patients husband  returned call to CMA Elveta Rape.He stated that he woulkd reach out to her neurologist to see if they could have the depakote prescribed to her in a liquid form

## 2023-11-07 NOTE — Assessment & Plan Note (Signed)
 Pending Urine culture. Treat empirically with keflex 500mg  BID for 7 days

## 2023-11-07 NOTE — Assessment & Plan Note (Signed)
 UA in office

## 2023-11-07 NOTE — Assessment & Plan Note (Signed)
 UA indicative of urinary tract infection.  Will treat patient with Keflex 500 mg twice daily for 7 days.  Thanks for historian patient spouse states that she started Keflex before I do see that she was given ceftriaxone in the past.  Penicillin allergy and sulfa allergy or from childhood and unsure if there is a true allergy.  Pending urine culture

## 2023-11-09 ENCOUNTER — Encounter: Payer: Self-pay | Admitting: Nurse Practitioner

## 2023-11-09 LAB — URINE CULTURE
MICRO NUMBER:: 16238796
SPECIMEN QUALITY:: ADEQUATE

## 2023-11-24 DIAGNOSIS — I10 Essential (primary) hypertension: Secondary | ICD-10-CM

## 2023-11-24 DIAGNOSIS — E890 Postprocedural hypothyroidism: Secondary | ICD-10-CM

## 2023-11-24 DIAGNOSIS — F03918 Unspecified dementia, unspecified severity, with other behavioral disturbance: Secondary | ICD-10-CM

## 2023-11-25 NOTE — Telephone Encounter (Signed)
 Melanie Long,  This patient has progressing dementia and is refusing to take pills. The family is asking about liquid form instead of tablets. Do any of these classes of medications have liquid form? Most importantly the Seroquel, Namenda, levothyroxine, benazepril?

## 2023-11-28 ENCOUNTER — Other Ambulatory Visit: Payer: Self-pay | Admitting: Pharmacist

## 2023-11-28 ENCOUNTER — Other Ambulatory Visit: Payer: Self-pay | Admitting: Primary Care

## 2023-11-28 DIAGNOSIS — F03918 Unspecified dementia, unspecified severity, with other behavioral disturbance: Secondary | ICD-10-CM

## 2023-11-28 NOTE — Progress Notes (Signed)
 Pharmacy Documentation CC: Clinical Drug Question / Medication Access  Summary: Patient has progressing dementia and is refusing to take pills. The family is asking about liquid form instead of tablets if possible. Most importantly the Seroquel, Namenda, levothyroxine, benazepril  Levothyroxine solution 100 mcg/5mL CMM PA: "We predict Prior Authorization is not required"  Namenda 2 mg/mL oral solution  Benazapril: No liquid formulation available - Liquid ACEi: enalapril, lisinopril - Liquid ARB: valsartan   Seroquel: No liquid formulation available - Can be specially compounded  Possible alternatives:    - aripiprazole = ODT, orally dispersible film, oral solution, LAI   - olanzapine = ODT, LAI   - risperidone = ODT, oral solution, LAI   Daron Ellen, PharmD Clinical Pharmacist [facility] [phone]

## 2023-11-28 NOTE — Telephone Encounter (Signed)
 Copied from CRM [id number]. Topic: Clinical - Medication Refill >> Nov 28, 2023  2:16 PM Juluis Ok wrote: Most Recent Primary Care Visit:  Provider: Dorothe Gaster  Department: [facility]  Visit Type: ACUTE  Date: 11/07/2023  Medication: QUEtiapine (SEROQUEL) 25 MG tablet  Has the patient contacted their pharmacy? Yes, pharmacy requesting approval  (Agent: If no, request that the patient contact the pharmacy for the refill. If patient does not wish to contact the pharmacy document the reason why and proceed with request.) (Agent: If yes, when and what did the pharmacy advise?)  Is this the correct pharmacy for this prescription? Yes If no, delete pharmacy and type the correct one.  This is the patient's preferred pharmacy:  [facility] [address], [place] - [address] [place] [address] Phone: [phone] Fax: [phone]    Has the prescription been filled recently? No  Is the patient out of the medication? Yes  Has the patient been seen for an appointment in the last year OR does the patient have an upcoming appointment? Yes  Can we respond through MyChart? No, prefers phone call at [phone] or [phone]  Agent: Please be advised that Rx refills may take up to 3 business days. We ask that you follow-up with your pharmacy.

## 2023-11-29 NOTE — Telephone Encounter (Signed)
 Thank you!  Just double check me...  Levothyroxine solution 100 mcg/5mL, she would take 6 ml every morning on an empty stomach. No food or other medications for 30 minutes. Currently on 125 mcg in pill form.  Namenda 2 mg/mL oral solution, she would take 5 ml twice daily.   Benazapril: No liquid formulation available  We could switch her to liquid lisinopril 1 mg/ml. We can start her at 20 ml which is comparable to her benazepril 20 mg.  Is this right?

## 2023-11-30 ENCOUNTER — Telehealth: Payer: Self-pay | Admitting: Primary Care

## 2023-11-30 DIAGNOSIS — F03918 Unspecified dementia, unspecified severity, with other behavioral disturbance: Secondary | ICD-10-CM

## 2023-11-30 MED ORDER — LISINOPRIL 1 MG/ML PO SOLN: 20.0000 mL | Freq: Every day | ORAL | 1 refills | Status: DC

## 2023-11-30 MED ORDER — QUETIAPINE FUMARATE 50 MG PO TABS: 50.0000 mg | ORAL_TABLET | Freq: Every day | ORAL | 1 refills | Status: DC

## 2023-11-30 MED ORDER — LEVOTHYROXINE SODIUM 100 MCG/5ML PO SOLN: ORAL | 1 refills | Status: DC

## 2023-11-30 MED ORDER — MEMANTINE HCL 2 MG/ML PO SOLN
5.0000 mL | Freq: Two times a day (BID) | ORAL | 1 refills | Status: DC
Start: 2023-11-30 — End: 2024-01-25

## 2023-11-30 MED ORDER — QUETIAPINE FUMARATE 25 MG PO TABS: 25.0000 mg | ORAL_TABLET | Freq: Every day | ORAL | 0 refills | Status: DC

## 2023-11-30 NOTE — Telephone Encounter (Signed)
 Noted, will send new prescription to pharmacy now.

## 2023-11-30 NOTE — Addendum Note (Signed)
 Addended by: Jahquan Klugh K on: 11/30/2023 04:16 PM   Modules accepted: Orders

## 2023-11-30 NOTE — Telephone Encounter (Signed)
 Routing to provider for review, please see note below.   Copied from CRM [id number]. Topic: Clinical - Prescription Issue >> Nov 30, 2023  9:33 AM Melanie Long wrote: Reason for CRM: Patient's QUEtiapine (SEROQUEL) 25 MG tablet is fully out and insurance will not refill due to refill being too soon. Patient's son stated that she was told to take 2 a day, not 1 tablet daily which is why she is out.  The pharmacist is asking for a new script that can reflect that the patient is in fact supposed to be taking 2 tablets daily.

## 2023-12-05 ENCOUNTER — Telehealth: Payer: Self-pay

## 2023-12-05 ENCOUNTER — Other Ambulatory Visit (HOSPITAL_COMMUNITY): Payer: Self-pay

## 2023-12-05 NOTE — Telephone Encounter (Signed)
 Pharmacy Patient Advocate Encounter   Received notification from Onbase that prior authorization for Memantine  HCL 2mg /ml solution is required/requested.   Insurance verification completed.   The patient is insured through [employer] .   Per test claim: The current 30 day co-pay is, $107.39.  No PA needed at this time. This test claim was processed through [facility]- copay amounts may vary at other pharmacies due to pharmacy/plan contracts, or as the patient moves through the different stages of their insurance plan.

## 2023-12-05 NOTE — Telephone Encounter (Signed)
 Pharmacy Patient Advocate Encounter   Received notification from Onbase that prior authorization for Thyquidity  100MCG/5ML solution is required/requested.   Insurance verification completed.   The patient is insured through [facility] .   Per test claim: PA required; PA submitted to above mentioned insurance via CoverMyMeds Key/confirmation #/EOC [id number] Status is pending

## 2023-12-05 NOTE — Telephone Encounter (Signed)
 Pharmacy Patient Advocate Encounter   Received notification from Onbase that prior authorization for Qbrelis  1MG /ML solution is required/requested.   Insurance verification completed.   The patient is insured through [facility] .   Per test claim: PA required; PA submitted to above mentioned insurance via CoverMyMeds Key/confirmation #/EOC [id number] Status is pending

## 2023-12-07 NOTE — Telephone Encounter (Signed)
 Pharmacy Patient Advocate Encounter  Received notification from [employer] that Prior Authorization for Qbrelis  has been APPROVED from 12/07/2023 to 08/15/2024   PA #/Case ID/Reference #: [id number]

## 2023-12-07 NOTE — Telephone Encounter (Signed)
 Pharmacy Patient Advocate Encounter  Received notification from [employer] that Prior Authorization for Thyquidity  has been APPROVED from 12/07/2023 to 08/15/2024   PA #/Case ID/Reference #: [id number]

## 2023-12-13 MED ORDER — LEVOTHYROXINE SODIUM 125 MCG PO TABS: ORAL_TABLET | ORAL | 2 refills | Status: AC

## 2023-12-19 DIAGNOSIS — F03B4 Unspecified dementia, moderate, with anxiety: Secondary | ICD-10-CM

## 2023-12-20 MED ORDER — MIRTAZAPINE 15 MG PO TABS: 15.0000 mg | ORAL_TABLET | Freq: Every day | ORAL | 0 refills | Status: DC

## 2024-01-16 DIAGNOSIS — I10 Essential (primary) hypertension: Secondary | ICD-10-CM

## 2024-01-16 MED ORDER — BENAZEPRIL HCL 20 MG PO TABS: 20.0000 mg | ORAL_TABLET | Freq: Every day | ORAL | 1 refills | Status: AC

## 2024-01-25 ENCOUNTER — Encounter: Payer: Self-pay | Admitting: Primary Care

## 2024-01-25 ENCOUNTER — Ambulatory Visit (INDEPENDENT_AMBULATORY_CARE_PROVIDER_SITE_OTHER): Admitting: Primary Care

## 2024-01-25 VITALS — BP 98/62 | HR 95 | Temp 97.3°F | Ht 67.0 in | Wt 211.0 lb

## 2024-01-25 DIAGNOSIS — E782 Mixed hyperlipidemia: Secondary | ICD-10-CM

## 2024-01-25 DIAGNOSIS — F03B4 Unspecified dementia, moderate, with anxiety: Secondary | ICD-10-CM

## 2024-01-25 DIAGNOSIS — I10 Essential (primary) hypertension: Secondary | ICD-10-CM

## 2024-01-25 MED ORDER — ATORVASTATIN CALCIUM 10 MG PO TABS: 10.0000 mg | ORAL_TABLET | Freq: Every day | ORAL | 2 refills | Status: AC

## 2024-01-25 NOTE — Assessment & Plan Note (Signed)
 Lower side of normal today. Recommended family monitor BP at home.   Continue benazepril  20 mg daily for now.  Consider dose reduction if needed.

## 2024-01-25 NOTE — Assessment & Plan Note (Signed)
 Progressing.  Continue memantine  10 mg twice daily. Increase mirtazapine  to 30 mg at bedtime.  Her husband will update.

## 2024-01-25 NOTE — Patient Instructions (Signed)
 Increase the mirtazapine  to 30 mg at bedtime.  This will be 2 pills instead of 1.  Please keep me updated regarding sleep.  Monitor blood pressure at home as discussed.  It was a pleasure to see you today!

## 2024-01-25 NOTE — Progress Notes (Addendum)
 Subjective:    Patient ID: Melanie Long, female    DOB: 10-28-1950, 73 y.o.   MRN: [id number]  HPI  Melanie Long is a very pleasant 73 y.o. female with a history of hypertension, dementia, hypothyroidism, hyperlipidemia who presents today with her husband and daughter to discuss dementia.  Her husband and daughter provide information for HPI as patient is noncontributory.  She is needing a refill of her atorvastatin .  Currently managed on memantine  10 mg twice daily, mirtazapine  15 mg at bedtime, hydroxyzine  10 mg as needed for dementia with behavioral disturbance. No longer on Seroquel  as it became ineffective.   Since her last visit her family has noticed improvement in daytime anxiety and she is sleeping longer since initiation of mirtazipine 15 mg. She is not sleeping throughout the entire night. She will wake up 2 hours after falling asleep with confusion, not knowing where she is, crying out for her mother. She will stay up for 1-4 hours during the night.   Her appetite remains good. No changes in bowel habits. Her family does not check BP at home. There have been no falls.   BP Readings from Last 3 Encounters:  01/25/24 98/62  11/07/23 108/82  10/11/23 136/72     Review of Systems  Respiratory:  Negative for shortness of breath.   Gastrointestinal:  Negative for constipation and diarrhea.  Psychiatric/Behavioral:  Positive for behavioral problems, confusion and sleep disturbance. The patient is nervous/anxious.          Past Medical History:  Diagnosis Date   Acute cholecystitis 09/24/2023   Anxiety    Cystitis 11/07/2023   Dementia (HCC)    Depression    Diverticulitis    Essential hypertension    Hallucinations    High cholesterol    Hypothyroidism    Osteoarthritis     Social History   Socioeconomic History   Marital status: Married    Spouse name: Not on file   Number of children: 1   Years of education: College   Highest education level: Not on  file  Occupational History   Not on file  Tobacco Use   Smoking status: Never   Smokeless tobacco: Never  Substance and Sexual Activity   Alcohol use: Not Currently   Drug use: Never   Sexual activity: Not on file  Other Topics Concern   Not on file  Social History Narrative   Patient is retired.   Lives at home with her husband.   Drinks 3 cups of coffee daily.   Social Drivers of Corporate investment banker Strain: Low Risk  (10/12/2022)   Overall Financial Resource Strain (CARDIA)    Difficulty of Paying Living Expenses: Not hard at all  Food Insecurity: No Food Insecurity (09/24/2023)   Hunger Vital Sign    Worried About Running Out of Food in the Last Year: Never true    Ran Out of Food in the Last Year: Never true  Transportation Needs: Patient Unable To Answer (09/24/2023)   PRAPARE - Transportation    Lack of Transportation (Medical): Patient unable to answer    Lack of Transportation (Non-Medical): Patient unable to answer  Physical Activity: Inactive (10/12/2022)   Exercise Vital Sign    Days of Exercise per Week: 0 days    Minutes of Exercise per Session: 0 min  Stress: No Stress Concern Present (10/12/2022)   [employer] of Occupational Health - Occupational Stress Questionnaire    Feeling of Stress : Not at  all  Social Connections: Unknown (09/24/2023)   Social Connection and Isolation Panel [NHANES]    Frequency of Communication with Friends and Family: Patient unable to answer    Frequency of Social Gatherings with Friends and Family: Patient unable to answer    Attends Religious Services: Patient unable to answer    Active Member of Clubs or Organizations: Patient unable to answer    Attends Banker Meetings: Patient unable to answer    Marital Status: Living with partner  Intimate Partner Violence: Patient Unable To Answer (09/24/2023)   Humiliation, Afraid, Rape, and Kick questionnaire    Fear of Current or Ex-Partner: Patient unable to answer     Emotionally Abused: Patient unable to answer    Physically Abused: Patient unable to answer    Sexually Abused: Patient unable to answer    Past Surgical History:  Procedure Laterality Date   BREAST BIOPSY Right 12/27/2019   distortion, ribbon, discordant   COLON SURGERY     Secondary to acute diverticulitis    COLONOSCOPY     COLONOSCOPY WITH PROPOFOL  N/A 07/16/2021   Procedure: COLONOSCOPY WITH PROPOFOL ;  Surgeon: Selena Daily, MD;  Location: [facility] ENDOSCOPY;  Service: Gastroenterology;  Laterality: N/A;  PATIENT HAS DEMENTIA  HUSBAND IS POA - TO COME TO ENDO WITH PATIENT   THYROIDECTOMY      Family History  Problem Relation Age of Onset   Arthritis Mother    Diabetes Mother    Breast cancer Mother        9's   Diabetes Father    Heart attack Father     Allergies  Allergen Reactions   Ciprofloxacin Other (See Comments)    Unknown reaction   Elemental Sulfur Other (See Comments)    Unknown reaction   Penicillins Other (See Comments)    Unknown reaction   Sulfa Antibiotics Other (See Comments)    Unknown reaction   Tetanus-Diphth-Acell Pertussis Other (See Comments)    Unknown reaction    Current Outpatient Medications on File Prior to Visit  Medication Sig Dispense Refill   benazepril  (LOTENSIN ) 20 MG tablet Take 1 tablet (20 mg total) by mouth daily. for blood pressure. 90 tablet 1   levothyroxine  (SYNTHROID ) 125 MCG tablet Take 1 tablet by mouth every morning on an empty stomach with water only.  No food or other medications for 30 minutes. 90 tablet 2   memantine  (NAMENDA ) 10 MG tablet Take 10 mg by mouth 2 (two) times daily.     mirtazapine  (REMERON ) 15 MG tablet Take 1 tablet (15 mg total) by mouth at bedtime. For sleep and anxiety 90 tablet 0   No current facility-administered medications on file prior to visit.    BP 98/62   Pulse 95   Temp (!) 97.3 F (36.3 C) (Temporal)   Ht 5' 7 (1.702 m)   Wt 211 lb (95.7 kg)   SpO2 96%   BMI 33.05  kg/m  Objective:   Physical Exam Cardiovascular:     Rate and Rhythm: Normal rate and regular rhythm.  Pulmonary:     Effort: Pulmonary effort is normal.     Breath sounds: Normal breath sounds.  Musculoskeletal:     Cervical back: Neck supple.  Skin:    General: Skin is warm and dry.  Neurological:     Mental Status: She is alert.     Comments: Follows a few commands  Psychiatric:        Mood and Affect: Mood  normal.           Assessment & Plan:  Essential hypertension Assessment & Plan: Lower side of normal today. Recommended family monitor BP at home.   Continue benazepril  20 mg daily for now.  Consider dose reduction if needed.   Moderate dementia with anxiety, unspecified dementia type [facility]) Assessment & Plan: Progressing.  Continue memantine  10 mg twice daily. Increase mirtazapine  to 30 mg at bedtime.  Her husband will update.       Mixed hyperlipidemia -     Atorvastatin  Calcium ; Take 1 tablet (10 mg total) by mouth daily. for cholesterol.  Dispense: 90 tablet; Refill: 2        Gabriel John, NP

## 2024-01-25 NOTE — Addendum Note (Signed)
 Addended by: Aydia Maj K on: 01/25/2024 11:13 AM   Modules accepted: Orders

## 2024-01-31 DIAGNOSIS — F03918 Unspecified dementia, unspecified severity, with other behavioral disturbance: Secondary | ICD-10-CM

## 2024-02-21 DIAGNOSIS — F03918 Unspecified dementia, unspecified severity, with other behavioral disturbance: Secondary | ICD-10-CM

## 2024-02-22 MED ORDER — MIRTAZAPINE 45 MG PO TABS: 45.0000 mg | ORAL_TABLET | Freq: Every day | ORAL | 1 refills | Status: AC

## 2024-04-11 ENCOUNTER — Other Ambulatory Visit: Payer: Self-pay | Admitting: Primary Care

## 2024-04-11 DIAGNOSIS — Z1231 Encounter for screening mammogram for malignant neoplasm of breast: Secondary | ICD-10-CM

## 2024-04-19 ENCOUNTER — Encounter: Payer: Self-pay | Admitting: Neurology

## 2024-04-19 ENCOUNTER — Ambulatory Visit: Payer: Medicare Other | Admitting: Neurology

## 2024-04-19 VITALS — BP 127/73 | HR 78 | Wt 203.0 lb

## 2024-04-19 DIAGNOSIS — G300 Alzheimer's disease with early onset: Secondary | ICD-10-CM

## 2024-04-19 DIAGNOSIS — F03911 Unspecified dementia, unspecified severity, with agitation: Secondary | ICD-10-CM

## 2024-04-19 DIAGNOSIS — F02C18 Dementia in other diseases classified elsewhere, severe, with other behavioral disturbance: Secondary | ICD-10-CM | POA: Diagnosis not present

## 2024-04-19 MED ORDER — LORAZEPAM 1 MG PO TABS: 1.0000 mg | ORAL_TABLET | Freq: Three times a day (TID) | ORAL | 0 refills | Status: DC

## 2024-04-19 MED ORDER — DIVALPROEX SODIUM 125 MG PO CSDR: 250.0000 mg | DELAYED_RELEASE_CAPSULE | Freq: Two times a day (BID) | ORAL | 11 refills | Status: AC

## 2024-04-19 MED ORDER — QUETIAPINE FUMARATE 50 MG PO TABS
50.0000 mg | ORAL_TABLET | Freq: Every day | ORAL | 0 refills | Status: DC
Start: 2024-04-19 — End: 2024-05-24

## 2024-04-19 MED ORDER — DIVALPROEX SODIUM ER 500 MG PO TB24: 500.0000 mg | ORAL_TABLET | Freq: Every day | ORAL | 0 refills | Status: DC

## 2024-04-19 NOTE — Patient Instructions (Addendum)
 Start Seroquel  50 mg nightly and Ativan  1 mg nightly Restart Depakote   Sprinkle 250 mg daily. Was discontinued after hospital admission, due to difficulty swallowing and not able to crush medication  Continue your other medications including memantine , and Remeron  Continue follow-up PCP Please contact me for any additional question or concern Return 6 to 8 months or sooner if worse.

## 2024-04-19 NOTE — Progress Notes (Signed)
 [facility]  PATIENT: Melanie Long DOB: September 26, 1950  REQUESTING CLINICIAN: Gretta Comer POUR, NP HISTORY FROM: Husband  REASON FOR VISIT: Seizure and dementia follow up    HISTORICAL  CHIEF COMPLAINT:  Chief Complaint  Patient presents with   Seizures    Rm12, Family member present   Dementia    Follow up    INTERVAL HISTORY 04/19/2024:  Patient presented for follow-up, she is accompanied by her husband and daughter.  Last visit was a year ago since then husband tells me that her memory and behavior Worse.  This big change occurred after being admitted in February for gallbladder issue. Gall bladder issues in February since then has worsening sundowning  Panic attack, manic attack, and psychosis, then wants to go home, repeats that she is going to die and calls for her mother  Distraught  Worsening hallucinations seeing people, and animal  Wants to get up and walk, and had hit her head, 2 falls since last visit  Not sleeping at night, PCP had try low dose Seroquel , and low-dose lorazepam  but still not sleeping.  Currently on mirtazapine .   No seizure or seizure like activity.    INTERVAL HISTORY 04/14/2023:  Patient presents today for follow-up, she is accompanied by her husband.  Last visit was a year ago and since then she has been stable, her dementia is severe, now husband has to feed her.  They are still hallucinations, but she does not get upset anymore.  No falls since last visit.  She is compliant compliant with all of medications. Denies any seizure or seizure like activity    INTERVAL HISTORY 04/13/22:  Patient presents today for follow-up, she is accompanied by her husband.  History mainly obtained by husband.  Husband states that patient was diagnosed with dementia 10 years ago, she is 100% dependent on her husband.  Her family history includes maternal grandfather and maternal grandmother with dementia, no one was diagnosed at early age.  She  never was diagnosed with TBI, no stroke, no seizures, and no sleep apnea.  She did have a diagnosis of depression and anxiety after her initial diagnosis of Dementia. Since starting the Depakote , it seemed like her mood has improved.  Denies any additional seizures since last visit.   TBI:   No past history of TBI Stroke:   no past history of stroke Seizures:   no past history of seizures Sleep:   no history of sleep apnea.   Mood: Yes, after her diagnosis of dementia she had depression  Functional status: Dependent in all ADLs and IADLs Patient lives with husband and she is 100% dependent on him.   Cooking: No Cleaning: No Shopping: No Bathing: No, husband Toileting: No, husband Driving: No Bills: No Medications: Husband help with all of her medication Ever left the stove on by accident?:  Not applicable Forget how to use items around the house?:  Yes Getting lost going to familiar places?:  Yes Forgetting loved ones names?:  Yes is Word finding difficulty?  Yes Sleep: Okay   HISTORY OF PRESENT ILLNESS:  This is a 73 year old woman with past medical history of moderate to severe dementia, diagnosed 10 years ago, hypertension, hyperlipidemia, hypothyroidism, anxiety and depression who is presenting after her first lifetime seizure on June 23.  Husband reports symptoms started 2 years ago when she started having myoclonic jerks mainly in the morning.  On June 23 during her sleep patient had full body convulsion lasting about 30 seconds,  husband's report after the event patient could not wake and it was very hard to control her body.  EMS was called and patient was taken to the ED.  Husband denies any tongue biting, denies any urinary incontinence and no injuries.  In the ED, she did have a head CT which showed generalized brain atrophy but no other abnormality, and patient was started on Keppra  500 mg twice daily.  Husband reports since starting Keppra  patient has been very  lethargic.    OTHER MEDICAL CONDITIONS: Moderate to severe Dementia, hypertension, hyperlipidemia   REVIEW OF SYSTEMS: Full 14 system review of systems performed and negative with exception of: Unable to fully obtain   ALLERGIES: Allergies  Allergen Reactions   Ciprofloxacin Other (See Comments)    Unknown reaction   Elemental Sulfur Other (See Comments)    Unknown reaction   Penicillins Other (See Comments)    Unknown reaction   Sulfa Antibiotics Other (See Comments)    Unknown reaction   Tetanus-Diphth-Acell Pertussis Other (See Comments)    Unknown reaction    HOME MEDICATIONS: Outpatient Medications Prior to Visit  Medication Sig Dispense Refill   atorvastatin  (LIPITOR) 10 MG tablet Take 1 tablet (10 mg total) by mouth daily. for cholesterol. 90 tablet 2   benazepril  (LOTENSIN ) 20 MG tablet Take 1 tablet (20 mg total) by mouth daily. for blood pressure. 90 tablet 1   levothyroxine  (SYNTHROID ) 125 MCG tablet Take 1 tablet by mouth every morning on an empty stomach with water only.  No food or other medications for 30 minutes. 90 tablet 2   memantine  (NAMENDA ) 10 MG tablet Take 10 mg by mouth 2 (two) times daily.     mirtazapine  (REMERON ) 45 MG tablet Take 1 tablet (45 mg total) by mouth at bedtime. For sleep 90 tablet 1   No facility-administered medications prior to visit.    PAST MEDICAL HISTORY: Past Medical History:  Diagnosis Date   Acute cholecystitis 09/24/2023   Anxiety    Cystitis 11/07/2023   Dementia (HCC)    Depression    Diverticulitis    Essential hypertension    Hallucinations    High cholesterol    Hypothyroidism    Osteoarthritis     PAST SURGICAL HISTORY: Past Surgical History:  Procedure Laterality Date   BREAST BIOPSY Right 12/27/2019   distortion, ribbon, discordant   COLON SURGERY     Secondary to acute diverticulitis    COLONOSCOPY     COLONOSCOPY WITH PROPOFOL  N/A 07/16/2021   Procedure: COLONOSCOPY WITH PROPOFOL ;  Surgeon: Unk Corinn Skiff, MD;  Location: [facility] ENDOSCOPY;  Service: Gastroenterology;  Laterality: N/A;  PATIENT HAS DEMENTIA  HUSBAND IS POA - TO COME TO ENDO WITH PATIENT   THYROIDECTOMY      FAMILY HISTORY: Family History  Problem Relation Age of Onset   Arthritis Mother    Diabetes Mother    Breast cancer Mother        37's   Diabetes Father    Heart attack Father     SOCIAL HISTORY: Social History   Socioeconomic History   Marital status: Married    Spouse name: Not on file   Number of children: 1   Years of education: College   Highest education level: Not on file  Occupational History   Not on file  Tobacco Use   Smoking status: Never   Smokeless tobacco: Never  Substance and Sexual Activity   Alcohol use: Not Currently   Drug use: Never  Sexual activity: Not on file  Other Topics Concern   Not on file  Social History Narrative   Patient is retired.   Lives at home with her husband.   Drinks 3 cups of coffee daily.   Social Drivers of Corporate investment banker Strain: Low Risk  (10/12/2022)   Overall Financial Resource Strain (CARDIA)    Difficulty of Paying Living Expenses: Not hard at all  Food Insecurity: No Food Insecurity (09/24/2023)   Hunger Vital Sign    Worried About Running Out of Food in the Last Year: Never true    Ran Out of Food in the Last Year: Never true  Transportation Needs: Patient Unable To Answer (09/24/2023)   PRAPARE - Transportation    Lack of Transportation (Medical): Patient unable to answer    Lack of Transportation (Non-Medical): Patient unable to answer  Physical Activity: Inactive (10/12/2022)   Exercise Vital Sign    Days of Exercise per Week: 0 days    Minutes of Exercise per Session: 0 min  Stress: No Stress Concern Present (10/12/2022)   [employer] of Occupational Health - Occupational Stress Questionnaire    Feeling of Stress : Not at all  Social Connections: Unknown (09/24/2023)   Social Connection and Isolation Panel     Frequency of Communication with Friends and Family: Patient unable to answer    Frequency of Social Gatherings with Friends and Family: Patient unable to answer    Attends Religious Services: Patient unable to answer    Active Member of Clubs or Organizations: Patient unable to answer    Attends Banker Meetings: Patient unable to answer    Marital Status: Living with partner  Intimate Partner Violence: Patient Unable To Answer (09/24/2023)   Humiliation, Afraid, Rape, and Kick questionnaire    Fear of Current or Ex-Partner: Patient unable to answer    Emotionally Abused: Patient unable to answer    Physically Abused: Patient unable to answer    Sexually Abused: Patient unable to answer    PHYSICAL EXAM  GENERAL EXAM/CONSTITUTIONAL: Vitals:  Vitals:   04/19/24 1426  BP: 127/73  Pulse: 78  SpO2: 98%  Weight: 203 lb (92.1 kg)     Body mass index is 31.79 kg/m. Wt Readings from Last 3 Encounters:  04/19/24 203 lb (92.1 kg)  01/25/24 211 lb (95.7 kg)  11/07/23 215 lb 6.4 oz (97.7 kg)   Patient is in distress today, crying, stating that she is going to die. Calling for her mother, asking why she feels this way.    MUSCULOSKELETAL: Gait, strength, tone, movements noted in Neurologic exam below  NEUROLOGIC: MENTAL STATUS:      No data to display         Awake, oriented to person but not place or time.  Unable to tell me her date of birth.  Unable to recall the ED visit.  Husband states that patient cannot remember what happened this morning. Crying, in distress, Husband and daughter actively trying to assist her.  Pupil equal round and reactive, unable to count count digits, unable to identify object even when given to her hands.  Does not blink to tread. Face is symmetric.  Full strength in upper and lower extremity.  Uses a wheelchair today.    DIAGNOSTIC DATA (LABS, IMAGING, TESTING) - I reviewed patient records, labs, notes, testing and imaging myself  where available.  Lab Results  Component Value Date   WBC 5.8 10/11/2023   HGB 13.0  10/11/2023   HCT 39.1 10/11/2023   MCV 92.0 10/11/2023   PLT 236.0 10/11/2023      Component Value Date/Time   NA 139 10/11/2023 1159   K 4.6 10/11/2023 1159   CL 103 10/11/2023 1159   CO2 28 10/11/2023 1159   GLUCOSE 82 10/11/2023 1159   BUN 14 10/11/2023 1159   CREATININE 0.84 10/11/2023 1159   CALCIUM  8.7 10/11/2023 1159   PROT 6.6 10/11/2023 1159   ALBUMIN 3.6 10/11/2023 1159   AST 19 10/11/2023 1159   ALT 15 10/11/2023 1159   ALKPHOS 58 10/11/2023 1159   BILITOT 0.6 10/11/2023 1159   GFRNONAA >60 09/27/2023 0601   Lab Results  Component Value Date   CHOL 208 (H) 10/11/2023   HDL 45.70 10/11/2023   LDLCALC 123 (H) 10/11/2023   TRIG 198.0 (H) 10/11/2023   Lab Results  Component Value Date   HGBA1C 4.9 12/09/2022   No results found for: VITAMINB12 Lab Results  Component Value Date   TSH 4.88 10/11/2023    Head CT 02/05/22 1. No acute finding. No focal cortical finding to correlate with the history. 2. Brain atrophy   EEG 03/24/22: Mild diffuse slowing    ASSESSMENT AND PLAN  73 y.o. year old female  with history of moderate to severe dementia, hypertension, hyperlipidemia, anxiety depression, who is presenting for follow up for her dementia.   Her dementia is worsening with now agitation, increased hallucinations and insomnia. Plan will be to start patient on the combination of Seroquel  and Ativan  at nighttime and hopefully this will help with her sleep and behavior.  We will also restart her Depakote , it was discontinued after her admission back in February, reason for discontinuing Depakote  not clear.  Will continue her on Namenda  and Remeron .  Advised husband to contact me if there are any worsening agitation, not sleeping at night or any additional questions.  He voiced understanding.  I will see him in 6 to 8 months for follow-up or sooner if worse.    1. 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 Agitation due to dementia [facility])      Patient Instructions  Start Seroquel  50 mg nightly and Ativan  1 mg nightly Restart Depakote   Sprinkle 250 mg daily. Was discontinued after hospital admission, due to difficulty swallowing and not able to crush medication  Continue your other medications including memantine , and Remeron  Continue follow-up PCP Please contact me for any additional question or concern Return 6 to 8 months or sooner if worse.   Per [place]  DMV statutes, patients with seizures are not allowed to drive until they have been seizure-free for six months.  Other recommendations include using caution when using heavy equipment or power tools. Avoid working on ladders or at heights. Take showers instead of baths.  Do not swim alone.  Ensure the water temperature is not too high on the home water heater. Do not go swimming alone. Do not lock yourself in a room alone (i.e. bathroom). When caring for infants or small children, sit down when holding, feeding, or changing them to minimize risk of injury to the child in the event you have a seizure. Maintain good sleep hygiene. Avoid alcohol.  Also recommend adequate sleep, hydration, good diet and minimize stress.   During the Seizure  - First, ensure adequate ventilation and place patients on the floor on their left side  Loosen clothing around the neck and ensure the airway is  patent. If the patient is clenching the teeth, do not force the mouth open with any object as this can cause severe damage - Remove all items from the surrounding that can be hazardous. The patient may be oblivious to what's happening and may not even know what he or she is doing. If the patient is confused and wandering, either gently guide him/her away and block access to outside areas - Reassure the individual and be comforting - Call 911. In most cases, the seizure ends before EMS  arrives. However, there are cases when seizures may last over 3 to 5 minutes. Or the individual may have developed breathing difficulties or severe injuries. If a pregnant patient or a person with diabetes develops a seizure, it is prudent to call an ambulance. - Finally, if the  patient does not regain full consciousness, then call EMS. Most patients will remain confused for about 45 to 90 minutes after a seizure, so you must use judgment in calling for help. - Avoid restraints but make sure the patient is in a bed with padded side rails - Place the individual in a lateral position with the neck slightly flexed; this will help the saliva drain from the mouth and prevent the tongue from falling backward - Remove all nearby furniture and other hazards from the area - Provide verbal assurance as the individual is regaining consciousness - Provide the patient with privacy if possible - Call for help and start treatment as ordered by the caregiver   After the Seizure (Postictal Stage)  After a seizure, most patients experience confusion, fatigue, muscle pain and/or a headache. Thus, one should permit the individual to sleep. For the next few days, reassurance is essential. Being calm and helping reorient the person is also of importance.  Most seizures are painless and end spontaneously. Seizures are not harmful to others but can lead to complications such as stress on the lungs, brain and the heart. Individuals with prior lung problems may develop labored breathing and respiratory distress.     No orders of the defined types were placed in this encounter.   Meds ordered this encounter  Medications   QUEtiapine  (SEROQUEL ) 50 MG tablet    Sig: Take 1 tablet (50 mg total) by mouth at bedtime.    Dispense:  30 tablet    Refill:  0   LORazepam  (ATIVAN ) 1 MG tablet    Sig: Take 1 tablet (1 mg total) by mouth every 8 (eight) hours.    Dispense:  30 tablet    Refill:  0   DISCONTD: divalproex   (DEPAKOTE  ER) 500 MG 24 hr tablet    Sig: Take 1 tablet (500 mg total) by mouth daily.    Dispense:  30 tablet    Refill:  0   divalproex  (DEPAKOTE  SPRINKLE) 125 MG capsule    Sig: Take 2 capsules (250 mg total) by mouth 2 (two) times daily.    Dispense:  120 capsule    Refill:  11    Return in about 6 months (around 10/17/2024).    Pastor Falling, MD 04/19/2024, 4:32 PM  [facility] [address] [place], [place] [address] [phone]

## 2024-04-27 ENCOUNTER — Encounter: Payer: Self-pay | Admitting: Neurology

## 2024-05-04 ENCOUNTER — Ambulatory Visit
Admission: RE | Admit: 2024-05-04 | Discharge: 2024-05-04 | Disposition: A | Discharge status: Home / Self Care | Source: Ambulatory Visit | Attending: Primary Care | Admitting: Primary Care

## 2024-05-04 DIAGNOSIS — Z1231 Encounter for screening mammogram for malignant neoplasm of breast: Secondary | ICD-10-CM | POA: Insufficient documentation

## 2024-05-07 ENCOUNTER — Other Ambulatory Visit: Payer: Self-pay | Admitting: Neurology

## 2024-05-07 MED ORDER — LORAZEPAM 0.5 MG PO TABS: 0.5000 mg | ORAL_TABLET | Freq: Every day | ORAL | 0 refills | Status: DC

## 2024-05-23 ENCOUNTER — Other Ambulatory Visit: Payer: Self-pay | Admitting: Neurology

## 2024-05-23 NOTE — Telephone Encounter (Signed)
 LOV 04/19/24  Next OV 11/28/24  Last refill 05/07/24, #30, 0 refills  Please review, thanks!

## 2024-11-28 ENCOUNTER — Ambulatory Visit: Admitting: Neurology
